# Patient Record
Sex: Female | Born: 1970 | Race: Black or African American | Hispanic: No | Marital: Married | Smoking: Former smoker
Health system: Southern US, Community
[De-identification: ages and names within clinical notes are randomized; demographics above are authoritative.]

## PROBLEM LIST (undated history)

## (undated) DIAGNOSIS — T7840XA Allergy, unspecified, initial encounter: Secondary | ICD-10-CM

## (undated) DIAGNOSIS — E079 Disorder of thyroid, unspecified: Secondary | ICD-10-CM

## (undated) HISTORY — DX: Allergy, unspecified, initial encounter: T78.40XA

## (undated) HISTORY — PX: DILATION AND CURETTAGE OF UTERUS: SHX78

---

## 1999-05-17 ENCOUNTER — Other Ambulatory Visit: Admission: RE | Admit: 1999-05-17 | Discharge: 1999-05-17 | Payer: Self-pay | Admitting: Obstetrics and Gynecology

## 1999-09-28 ENCOUNTER — Inpatient Hospital Stay (HOSPITAL_COMMUNITY): Admission: AD | Admit: 1999-09-28 | Discharge: 1999-10-01 | Payer: Self-pay | Admitting: *Deleted

## 1999-09-28 ENCOUNTER — Encounter (INDEPENDENT_AMBULATORY_CARE_PROVIDER_SITE_OTHER): Payer: Self-pay | Admitting: Specialist

## 2000-07-30 ENCOUNTER — Encounter: Payer: Self-pay | Admitting: Family Medicine

## 2000-07-30 ENCOUNTER — Ambulatory Visit (HOSPITAL_COMMUNITY): Admission: RE | Admit: 2000-07-30 | Discharge: 2000-07-30 | Payer: Self-pay | Admitting: Family Medicine

## 2001-04-22 ENCOUNTER — Encounter: Payer: Self-pay | Admitting: Internal Medicine

## 2001-04-22 ENCOUNTER — Ambulatory Visit (HOSPITAL_COMMUNITY): Admission: RE | Admit: 2001-04-22 | Discharge: 2001-04-22 | Payer: Self-pay | Admitting: Internal Medicine

## 2001-11-22 ENCOUNTER — Ambulatory Visit (HOSPITAL_COMMUNITY): Admission: RE | Admit: 2001-11-22 | Discharge: 2001-11-22 | Payer: Self-pay | Admitting: Internal Medicine

## 2001-11-22 ENCOUNTER — Encounter: Payer: Self-pay | Admitting: Internal Medicine

## 2002-07-31 ENCOUNTER — Emergency Department (HOSPITAL_COMMUNITY): Admission: EM | Admit: 2002-07-31 | Discharge: 2002-07-31 | Payer: Self-pay | Admitting: Emergency Medicine

## 2002-07-31 ENCOUNTER — Encounter: Payer: Self-pay | Admitting: Emergency Medicine

## 2002-10-09 ENCOUNTER — Ambulatory Visit (HOSPITAL_COMMUNITY): Admission: RE | Admit: 2002-10-09 | Discharge: 2002-10-09 | Payer: Self-pay | Admitting: *Deleted

## 2002-11-27 ENCOUNTER — Encounter: Admission: RE | Admit: 2002-11-27 | Discharge: 2002-11-27 | Payer: Self-pay | Admitting: *Deleted

## 2002-12-04 ENCOUNTER — Inpatient Hospital Stay (HOSPITAL_COMMUNITY): Admission: AD | Admit: 2002-12-04 | Discharge: 2002-12-04 | Payer: Self-pay | Admitting: *Deleted

## 2002-12-18 ENCOUNTER — Ambulatory Visit (HOSPITAL_COMMUNITY): Admission: RE | Admit: 2002-12-18 | Discharge: 2002-12-18 | Payer: Self-pay | Admitting: *Deleted

## 2003-02-06 ENCOUNTER — Ambulatory Visit (HOSPITAL_COMMUNITY): Admission: RE | Admit: 2003-02-06 | Discharge: 2003-02-06 | Payer: Self-pay | Admitting: Obstetrics and Gynecology

## 2003-02-15 ENCOUNTER — Inpatient Hospital Stay (HOSPITAL_COMMUNITY): Admission: AD | Admit: 2003-02-15 | Discharge: 2003-02-15 | Payer: Self-pay | Admitting: Obstetrics & Gynecology

## 2003-02-24 ENCOUNTER — Encounter (INDEPENDENT_AMBULATORY_CARE_PROVIDER_SITE_OTHER): Payer: Self-pay | Admitting: Specialist

## 2003-02-24 ENCOUNTER — Inpatient Hospital Stay (HOSPITAL_COMMUNITY): Admission: AD | Admit: 2003-02-24 | Discharge: 2003-02-26 | Payer: Self-pay | Admitting: Obstetrics & Gynecology

## 2003-03-31 ENCOUNTER — Inpatient Hospital Stay (HOSPITAL_COMMUNITY): Admission: AD | Admit: 2003-03-31 | Discharge: 2003-03-31 | Payer: Self-pay | Admitting: *Deleted

## 2003-08-15 ENCOUNTER — Encounter (INDEPENDENT_AMBULATORY_CARE_PROVIDER_SITE_OTHER): Payer: Self-pay | Admitting: Specialist

## 2003-08-15 ENCOUNTER — Inpatient Hospital Stay (HOSPITAL_COMMUNITY): Admission: AD | Admit: 2003-08-15 | Discharge: 2003-08-15 | Payer: Self-pay | Admitting: Obstetrics & Gynecology

## 2003-08-15 ENCOUNTER — Ambulatory Visit (HOSPITAL_COMMUNITY): Admission: AD | Admit: 2003-08-15 | Discharge: 2003-08-15 | Payer: Self-pay | Admitting: *Deleted

## 2003-08-25 ENCOUNTER — Encounter: Admission: RE | Admit: 2003-08-25 | Discharge: 2003-08-25 | Payer: Self-pay | Admitting: Obstetrics and Gynecology

## 2004-09-07 ENCOUNTER — Ambulatory Visit: Payer: Self-pay | Admitting: Nurse Practitioner

## 2004-09-08 ENCOUNTER — Ambulatory Visit (HOSPITAL_COMMUNITY): Admission: RE | Admit: 2004-09-08 | Discharge: 2004-09-08 | Payer: Self-pay | Admitting: Internal Medicine

## 2004-09-09 ENCOUNTER — Ambulatory Visit: Payer: Self-pay | Admitting: *Deleted

## 2005-06-19 ENCOUNTER — Emergency Department (HOSPITAL_COMMUNITY): Admission: EM | Admit: 2005-06-19 | Discharge: 2005-06-19 | Payer: Self-pay | Admitting: Emergency Medicine

## 2005-06-21 ENCOUNTER — Inpatient Hospital Stay (HOSPITAL_COMMUNITY): Admission: AD | Admit: 2005-06-21 | Discharge: 2005-06-21 | Payer: Self-pay | Admitting: Gynecology

## 2005-06-23 ENCOUNTER — Inpatient Hospital Stay (HOSPITAL_COMMUNITY): Admission: AD | Admit: 2005-06-23 | Discharge: 2005-06-23 | Payer: Self-pay | Admitting: Obstetrics & Gynecology

## 2005-06-30 ENCOUNTER — Inpatient Hospital Stay (HOSPITAL_COMMUNITY): Admission: RE | Admit: 2005-06-30 | Discharge: 2005-06-30 | Payer: Self-pay | Admitting: Obstetrics & Gynecology

## 2005-07-10 ENCOUNTER — Observation Stay (HOSPITAL_COMMUNITY): Admission: AD | Admit: 2005-07-10 | Discharge: 2005-07-11 | Payer: Self-pay | Admitting: Gynecology

## 2005-07-12 ENCOUNTER — Inpatient Hospital Stay (HOSPITAL_COMMUNITY): Admission: AD | Admit: 2005-07-12 | Discharge: 2005-07-12 | Payer: Self-pay | Admitting: Gynecology

## 2005-07-26 ENCOUNTER — Ambulatory Visit: Payer: Self-pay | Admitting: Gynecology

## 2005-09-06 ENCOUNTER — Ambulatory Visit: Payer: Self-pay | Admitting: Gynecology

## 2006-05-29 ENCOUNTER — Ambulatory Visit: Payer: Self-pay | Admitting: Nurse Practitioner

## 2006-06-01 ENCOUNTER — Ambulatory Visit (HOSPITAL_COMMUNITY): Admission: RE | Admit: 2006-06-01 | Discharge: 2006-06-01 | Payer: Self-pay | Admitting: Nurse Practitioner

## 2006-07-26 ENCOUNTER — Ambulatory Visit: Payer: Self-pay | Admitting: Internal Medicine

## 2006-08-01 ENCOUNTER — Ambulatory Visit: Payer: Self-pay | Admitting: Family Medicine

## 2006-08-01 ENCOUNTER — Encounter (INDEPENDENT_AMBULATORY_CARE_PROVIDER_SITE_OTHER): Payer: Self-pay | Admitting: Nurse Practitioner

## 2006-08-14 ENCOUNTER — Ambulatory Visit (HOSPITAL_COMMUNITY): Admission: RE | Admit: 2006-08-14 | Discharge: 2006-08-14 | Payer: Self-pay | Admitting: Nurse Practitioner

## 2006-10-17 ENCOUNTER — Encounter (INDEPENDENT_AMBULATORY_CARE_PROVIDER_SITE_OTHER): Payer: Self-pay | Admitting: *Deleted

## 2006-10-26 ENCOUNTER — Ambulatory Visit: Payer: Self-pay | Admitting: Gynecology

## 2006-10-26 ENCOUNTER — Encounter (INDEPENDENT_AMBULATORY_CARE_PROVIDER_SITE_OTHER): Payer: Self-pay | Admitting: Gynecology

## 2006-10-26 ENCOUNTER — Other Ambulatory Visit: Admission: RE | Admit: 2006-10-26 | Discharge: 2006-10-26 | Payer: Self-pay | Admitting: Gynecology

## 2006-11-07 ENCOUNTER — Ambulatory Visit: Payer: Self-pay | Admitting: *Deleted

## 2007-03-19 ENCOUNTER — Ambulatory Visit: Payer: Self-pay | Admitting: Internal Medicine

## 2007-03-21 ENCOUNTER — Ambulatory Visit (HOSPITAL_COMMUNITY): Admission: RE | Admit: 2007-03-21 | Discharge: 2007-03-21 | Payer: Self-pay | Admitting: Family Medicine

## 2007-09-30 ENCOUNTER — Encounter (INDEPENDENT_AMBULATORY_CARE_PROVIDER_SITE_OTHER): Payer: Self-pay | Admitting: Family Medicine

## 2007-09-30 ENCOUNTER — Ambulatory Visit: Payer: Self-pay | Admitting: Internal Medicine

## 2008-02-19 ENCOUNTER — Ambulatory Visit: Payer: Self-pay | Admitting: Internal Medicine

## 2008-02-19 ENCOUNTER — Encounter (INDEPENDENT_AMBULATORY_CARE_PROVIDER_SITE_OTHER): Payer: Self-pay | Admitting: Adult Health

## 2008-02-19 LAB — CONVERTED CEMR LAB
BUN: 10 mg/dL (ref 6–23)
Basophils Relative: 1 % (ref 0–1)
Calcium: 9.4 mg/dL (ref 8.4–10.5)
Chloride: 105 meq/L (ref 96–112)
Creatinine, Ser: 0.56 mg/dL (ref 0.40–1.20)
Eosinophils Absolute: 0.2 10*3/uL (ref 0.0–0.7)
Glucose, Bld: 81 mg/dL (ref 70–99)
HCT: 39.8 % (ref 36.0–46.0)
Hemoglobin: 12.9 g/dL (ref 12.0–15.0)
LH: 31.6 milliintl units/mL
Lymphocytes Relative: 45 % (ref 12–46)
MCHC: 32.4 g/dL (ref 30.0–36.0)
MCV: 80.1 fL (ref 78.0–100.0)
Monocytes Absolute: 0.4 10*3/uL (ref 0.1–1.0)
RDW: 13.5 % (ref 11.5–15.5)
Sodium: 141 meq/L (ref 135–145)
Total Bilirubin: 0.4 mg/dL (ref 0.3–1.2)
Total Protein: 7.3 g/dL (ref 6.0–8.3)

## 2008-02-20 ENCOUNTER — Encounter (INDEPENDENT_AMBULATORY_CARE_PROVIDER_SITE_OTHER): Payer: Self-pay | Admitting: Adult Health

## 2008-02-20 LAB — CONVERTED CEMR LAB: T4, Total: 8.3 ug/dL (ref 5.0–12.5)

## 2008-03-20 ENCOUNTER — Encounter (INDEPENDENT_AMBULATORY_CARE_PROVIDER_SITE_OTHER): Payer: Self-pay | Admitting: Adult Health

## 2008-03-20 ENCOUNTER — Ambulatory Visit: Payer: Self-pay | Admitting: Internal Medicine

## 2008-03-20 LAB — CONVERTED CEMR LAB
Cholesterol: 161 mg/dL (ref 0–200)
Triglycerides: 75 mg/dL (ref <150)

## 2008-03-24 ENCOUNTER — Ambulatory Visit: Payer: Self-pay | Admitting: Internal Medicine

## 2008-05-27 ENCOUNTER — Ambulatory Visit: Payer: Self-pay | Admitting: Family Medicine

## 2008-06-17 ENCOUNTER — Ambulatory Visit: Payer: Self-pay | Admitting: Obstetrics and Gynecology

## 2008-06-18 ENCOUNTER — Encounter: Payer: Self-pay | Admitting: Obstetrics & Gynecology

## 2008-06-22 ENCOUNTER — Ambulatory Visit (HOSPITAL_COMMUNITY): Admission: RE | Admit: 2008-06-22 | Discharge: 2008-06-22 | Payer: Self-pay | Admitting: Family Medicine

## 2008-07-01 ENCOUNTER — Ambulatory Visit: Payer: Self-pay | Admitting: Obstetrics & Gynecology

## 2008-07-24 ENCOUNTER — Ambulatory Visit: Payer: Self-pay | Admitting: Family Medicine

## 2008-11-12 ENCOUNTER — Ambulatory Visit: Payer: Self-pay | Admitting: Internal Medicine

## 2008-11-12 ENCOUNTER — Encounter (INDEPENDENT_AMBULATORY_CARE_PROVIDER_SITE_OTHER): Payer: Self-pay | Admitting: Adult Health

## 2008-11-12 LAB — CONVERTED CEMR LAB: TSH: 1.168 microintl units/mL (ref 0.350–4.500)

## 2009-01-11 ENCOUNTER — Ambulatory Visit: Payer: Self-pay | Admitting: Internal Medicine

## 2009-02-10 ENCOUNTER — Ambulatory Visit: Payer: Self-pay | Admitting: Obstetrics and Gynecology

## 2009-02-10 LAB — CONVERTED CEMR LAB: Estradiol: 17.3 pg/mL

## 2009-02-24 ENCOUNTER — Ambulatory Visit: Payer: Self-pay | Admitting: Obstetrics & Gynecology

## 2009-04-29 ENCOUNTER — Other Ambulatory Visit: Admission: RE | Admit: 2009-04-29 | Discharge: 2009-04-29 | Payer: Self-pay | Admitting: Gynecology

## 2009-04-29 ENCOUNTER — Ambulatory Visit: Payer: Self-pay | Admitting: Gynecology

## 2009-05-06 ENCOUNTER — Ambulatory Visit: Payer: Self-pay | Admitting: Gynecology

## 2010-02-19 ENCOUNTER — Encounter: Payer: Self-pay | Admitting: *Deleted

## 2010-06-14 NOTE — Group Therapy Note (Signed)
[name], KOHRS NO.:  [id number]   MEDICAL RECORD NO.:  [id number]          PATIENT TYPE:  WOC   LOCATION:  [facility]                   FACILITY:  [facility]   PHYSICIAN:  Johnella Moloney, MD        DATE OF BIRTH:  11-Jan-1971   DATE OF SERVICE:  06/17/2008                                  CLINIC NOTE   CHIEF COMPLAINT:  Secondary infertility.   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  The patient reports that she has had irregular menstrual  periods since menarche at age 21.  However, she has had 4 pregnancies.  She had 2 live births initially and then 2 miscarriages, the last 1 been  in 2007.  She said that since her last miscarriage, her periods have  been coming about every 2 months but is characterized with a small  amount of bleeding.  Over the last year, her periods came every 1-2  months.  However, since February 2010, she has been amenorrheic and has  not seen any bleeding.  She went to the [facility] for evaluation, and  their laboratory studies were remarkable for an [facility] of 39.3 which was  correspondent to a postmenopausal range and LH of 31.6.  She also had a  lipid profile that was normal and CBC and comprehensive metabolic panel  that was also normal at this visit.  The patient was referred to this  clinic for further evaluation of her secondary infertility and a  question of premature ovarian failure.   On interview of the patient who does not speak much English but was only  comfortable with a friend interpreting for her, the patient is primarily  [place] speaking.  The patient does report that she does not have any  family history of premature ovarian failure.  She has older sisters who  continue to have children.  Her mother died when she was very, very  young, so she does not remember if her mother went through menopause  early, and she  denies having any family history of autoimmune disorders  or other indications that could predispose her to premature ovarian  failure.   Of note, the diagnostic criteria for primary premature ovarian failure  in women younger than 40 years is the presence of 3 months of  amenorrhea, oligomenorrhea or dysfunctional uterine bleeding in  association with FSH levels in the menopausal range as defined by the  assay method employed, so she does not technically meet criteria for  this diagnosis.   PAST OBSTETRICAL/GYNECOLOGICAL HISTORY:  As above.  The patient had 2  vaginal deliveries and 2 missed ABs.  Her first missed AB was treated  with a dilatation and curettage.  The second one was treated with  Cytotec.  The patient had a Pap that showed atypical glandular cells in  2008 with normal follow-up.  Otherwise no cervical dysplasia.  The  patient denies any sexually transmitted infections.   PAST MEDICAL HISTORY:  None.   PAST SURGICAL  HISTORY:  Dilatation and curettage.   MEDICATIONS:  Prenatal vitamins daily.   ALLERGIES:  No known drug allergies.   SOCIAL HISTORY:  Patient is unemployed, and she smokes 4 cigarettes per  day and has been smoking for 2 years.  She does not drink any alcohol or  use any illicit drugs.  She denies any sexual or physical abuse.   FAMILY HISTORY:  Negative for any gynecologic cancers, breast cancer,  colon cancer or any other conditions.   PHYSICAL EXAMINATION:  Temperature 97.9, pulse 97, blood pressure  129/81, weight 186.8 pounds, height 65 inches.  In general, no apparent distress.  ABDOMEN:  Soft, nontender, nondistended.  PELVIC EXAM:  Is deferred at this visit.  EXTREMITIES:  No cyanosis, clubbing or edema; nontender.   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  Given that her FSH value was not significantly  elevated, it  was just noted to be 39, and normal range has a limit of  33, we will redo that [facility] analysis today.  We will also draw other labs  including HCG, TSH and prolactin levels as the diagnosis of premature  ovarian failure is possible.  Other evaluation would also include anti-  adrenal and anti-21 hydroxylase antibody screening and also antithyroid  peroxidase antibodies and TSH levels because of the association of  hypothyroidism with spontaneous premature ovarian failure.  If these  tests come back still concerned for premature ovarian failure, the  patient will need to be referred to reproductive endocrinology and  infertility specialist for further evaluation as she just might be a  candidate for gonadotropins or donor oocytes and in vitro fertilization.  Will also get a pelvic ultrasound to rule out any structural reasons for  her amenorrhea or infertility.  This plan was agreed upon by the  patient.  She will come back in 1 to 2 weeks to discuss the results of  her extensive laboratory evaluation and her pelvic ultrasound and for  possible referral to an REI specialist.           ______________________________  Johnella Moloney, MD     UD/MEDQ  D:  06/17/2008  T:  06/17/2008  Job:  [id number]

## 2010-06-17 NOTE — Op Note (Signed)
NAMEMARTITA, BRUMM NO.:  [id number]   MEDICAL RECORD NO.:  [id number]                   PATIENT TYPE:  AMB   LOCATION:  [facility]                                  [facility]   PHYSICIAN:  Hal Morales, M.D.             DATE OF BIRTH:  01/29/1971   DATE OF PROCEDURE:  08/15/2003  DATE OF DISCHARGE:                                 OPERATIVE REPORT   PREOPERATIVE DIAGNOSES:  Inevitable abortion, hemodynamically unstable with  hemorrhage.   POSTOPERATIVE DIAGNOSES:  Inevitable abortion, hemodynamically unstable with  hemorrhage.   OPERATION:  Suction dilatation and evacuation.   SURGEON:  Dr. Dierdre Forth   ANESTHESIA:  Monitored anesthesia care and local.   ESTIMATED BLOOD LOSS:  Less than 100 mL.   COMPLICATIONS:  None.   FINDINGS:  The uterus was enlarged to approximately 10 weeks size with a  large amount of products of conception obtained at D&E.  The blood type is A  positive.   DESCRIPTION OF PROCEDURE:  A discussion was held with the patient and her  husband via an interpreter concerning the diagnosis of inevitable abortion,  her hemodynamic instability, and the recommendation for dilation and  evacuation.  The risks of anesthesia, bleeding, infection, damage to  adjacent organs, and uterine perforation were explained via the interpreter  and after this discussion, the patient and her husband consented to proceed.  She was taken to the operating room after appropriate identification and  placed on the operating table.  After placement of equipment for monitored  anesthesia care, she was placed in the lithotomy position.  The perineum and  vagina were prepped with multiple layers of Betadine and a red Robinson  catheter used to empty the bladder.  The perineum was draped in a sterile  field.  A Graves speculum was placed in the vagina and a paracervical block  achieved with a total of 10 mL of 0.25% Marcaine in the 5 and 7  o'clock  positions.  A single-tooth tenaculum was placed on the anterior cervix.  The  cervix was already dilated enough to accommodate a #10 suction catheter, and  this was used to suction curette the entire endometrial cavity with a large  amount of products of conception obtained.  A sharp curette was then used to  ensure that all products of conception had been removed.  Hemostasis was  noted to be adequate.  All instruments were then removed from the vagina and  the patient taken from the operating room to the recovery room in  satisfactory condition having tolerated the procedure well with sponge and  instrument counts correct.  She received 1 g of Ancef intravenously while in  the operating room.   DISCHARGE INSTRUCTIONS:  Printed instructions for dilatation and evacuation  from [facility].   DISCHARGE MEDICATIONS:  1. Ibuprofen 600 mg p.o. q.6h. p.r.n. pain.  2. Methergine 0.2 mg p.o. q.6h. x 8 doses.  3. Doxycycline 100 mg p.o. b.i.d. x 5 days.   FOLLOW-UP:  The patient is to follow up in [facility]  on August 25, 2003, at 2:15 p.m.                                               Hal Morales, M.D.    VPH/MEDQ  D:  08/15/2003  T:  08/16/2003  Job:  [id number]

## 2010-06-17 NOTE — Group Therapy Note (Signed)
[name], SUPAK NO.:  [id number]   MEDICAL RECORD NO.:  [id number]                   PATIENT TYPE:  OUT   LOCATION:  [facility]                           [facility]  [facility]   PHYSICIAN:  Elsie Lincoln, MD                   DATE OF BIRTH:  1970-03-23   DATE OF SERVICE:  08/25/2003                                    CLINIC NOTE   REASON FOR VISIT:  The patient is a G3 para 2-0-1-2 status post a D&C on  August 15, 2003 by Dr. Pennie Rushing.  The patient had come diagnosed with a missed  AB earlier on July 16 and given Cytotec by Dr. Pennie Rushing.  The patient had  extreme bleeding and returned to the MAU.  She was taken to the operating  room where a D&C was done and there is positive POC dated on August 15, 2003.  Since then the patient has not had any vaginal bleeding.  The patient does  wish to become pregnant again immediately.  However, we told her to wait at  least for one normal menstrual cycle before becoming pregnant.  The patient  is not taking prenatal vitamins and she was reminded to do so and this was  important.  The patient also recommended to get early prenatal care.  She  had a baby here in January and she will follow up at [facility] for  those needs.   PHYSICAL EXAMINATION:  VITAL SIGNS:  Temperature 97.7, pulse 98, blood  pressure 109/65, weight 210.4, height 5 feet 7 inches.  ABDOMEN:  Soft, nontender, obese.  No rebound, no guarding.  PELVIC:  External genitalia with female circumcision.  Vagina pink, no  blood.  Cervix closed, nontender.  Adnexa and uterus are difficult to  palpate secondary to body habitus.   ASSESSMENT AND PLAN:  A 40 year old gravida 3 para 2-0-1-2 status post  Cytotec and D&C.  Positive products of conception.   1. Prenatal vitamins daily.  2. Able to get pregnant after one normal menstrual cycle.  3. Follow up at [facility].                                               Elsie Lincoln, MD    KL/MEDQ  D:   08/25/2003  T:  08/25/2003  Job:  [id number]

## 2010-06-17 NOTE — Op Note (Signed)
[facility]  PatientHORTENCIA, [name]                            MRN: [id number] Proc. Date: 09/29/99 Adm. Date:  [id number] Attending:  Cleatrice Burke                           Operative Report  PREOPERATIVE DIAGNOSES:       1. Intrauterine pregnancy at term.                               2. Prolonged rupture of membranes, exact                                  length uncertain.                               3. Nonreassuring fetal heart rate tracing.                               4. Status post clitorectomy.  POSTOPERATIVE DIAGNOSES:      1. Intrauterine pregnancy at term.                               2. Prolonged rupture of membranes, exact                                  length uncertain.                               3. Nonreassuring fetal heart rate tracing.                               4. Status post clitorectomy.                               5. Meconium stained amniotic fluid.                               6. Nuchal cord.  OPERATION/PROCEDURE:          Vacuum assisted vaginal delivery with midline episiotomy and repair of fourth degree extension of midline episiotomy.  ANESTHESIA:                   Local.  ESTIMATED BLOOD LOSS:         Estimated blood loss was 500 cc.  COMPLICATIONS:                None.  FINDINGS:                     The patient was delivered of a viable female infant, whose name is [name], with birth weight pending at the time of this dictation.  The infant was assigned Apgar scores of 9 at one minute and 9 at  five minutes.  Pediatrics was in attendance within one minute of the infants delivery.  DESCRIPTION OF PROCEDURE:     The patient had been pushing for approximately 90 minutes with some progress after complete dilatation.  The vertex was at the +2 station.  The patient had been having variable decelerations since the onset of pushing but over the last 15-20 minutes these variable decelerations had become  progressively deeper with nadir down to 80 beats per minute and with slower return to baseline.  The length of rupture of membranes was unknown and the status of the fluid was likewise unknown.  The patient was pushing moderately well but without evidence that delivery would ensue rapidly.  In light of some apparent deterioration in fetal heart rate and the expected slow progression toward delivery recommendation was made to the patient and her husband that vacuum assisted vaginal delivery be undertaken. The husband served as Engineer, technical sales and after explanation of the benefits of expeditious delivery and the risks of cephalohematoma and damage to maternal organs, the patient and her husband decided to proceed with vacuum assisted vaginal delivery.  The patient was in the lithotomy position and her perineum was prepped and draped.  The bladder was emptied by in-and-out catheterization.  Initially the Mityvac bell vacuum extractor was used to assist with descent of the fetal vertex to the perineum; however, that instrument popped off during the second application and the Kiwi was then applied for the third application, during which the infants head was delivered after cutting a midline episiotomy.  Upon recognition that meconium stained amniotic fluid was present the DeLee was used to suction the infants pharynx.  The loose nuchal cord was reduced over the head of the infant and the remainder of the infant delivered with maternal expulsive efforts.  The cord was clamped and cut.  The neonatal team had been paged and was in the delivery room within one minute of the infants delivery.  The appropriate cord blood was drawn and upon evidence of placental separation the placenta was removed with gentle traction and maternal expulsive efforts.  The cervix was inspected and found to be free of lacerations.  The perineum was inspected and a fourth degree extension of the midline episiotomy was noted.   This was then repaired in a layered fashion with sutures of 3-0 Vicryl.  Careful attention was paid to approximation of the muscle capsule.  Once the repair was complete ice packs were placed on the perineum and the mother was placed in the supine position for bonding with her infant, who remained in the LDR suite after the pediatricians had left.  The patient tolerated the procedure well.  It was anticipated that the infant would be admitted to the full-term nursery. DD:  09/29/99 TD:  09/30/99 Job: [id number]

## 2010-06-17 NOTE — Group Therapy Note (Signed)
NAMEZOIEY, CHRISTY NO.:  [id number]   MEDICAL RECORD NO.:  [id number]          PATIENT TYPE:  WOC   [facility]ATION[facility]  [facility]                   [facility]   PHYSICIAN:  Montey Hora, M.D.    DATE OF BIRTH:  [birth year]   DATE OF SERVICE:                                    CLINIC NOTE   HISTORY OF PRESENT ILLNESS:  This is a 40 year old G4, P2-0-2-2, who had a  missed AB at approximately two months treated with Cytotec on July 12, 2005,  at [facility].  Still having vaginal bleeding on June 27, seen at that time.  Had  normal HCG and told to follow up today.  The patient had discontinuation of  her bleeding with onset of period again on August 30, 2005.  That menstrual  cycle was mainly brown.  Discharge not a regular period.  Stopped after a  couple of days, and now she has had the reonset of some bleeding today.  She  does not use any contraception, and admits that she has been having regular  sexual relations having the desire to conceive a boy.  She is having cramps  like she is on her period now, and some slightly bleeding.  Is not taking  any medications.  Does not have any allergies.   SOCIAL HISTORY:  She is accompanied by an interpreter.   PHYSICAL EXAMINATION:  VITAL SIGNS:  Temperature 98.2, pulse 80, blood  pressure 124/85, weight 187.  GENERAL APPEARANCE:  WDWN, NAD.  HEART:  RRR without murmur.  LUNGS:  CTAP.  Normal respiratory effort.  ABDOMEN:  Nontender.  PELVIC:  A female circumcision.  Otherwise, normal left internal genitalia.  Moderate normal menstrual blood in the canal.  Uterus is 6-8 weeks size.  There is no adnexal masses or tenderness bilaterally.   ASSESSMENT/PLAN:  G4, P2-0-2-2, S/P, missed AB, responded with Cytotec.  Now  having apparent menstrual cycle.  Will check her urine pregnancy test.  If  positive, do a quantitative beta HCG, and at the same time check CBC to  assure that her anemia is resolving.     ______________________________  Montey Hora, M.D.     KR/MEDQ  D:  09/06/2005  T:  09/06/2005  Job:  [id number]

## 2010-06-17 NOTE — Discharge Summary (Signed)
[facility]  PatientSUGEY, [name]                            MRN: [id number] Adm. Date:  [id number] Disc. Date: [id number] Attending:  Cleatrice Burke Dictator:   Miguel Dibble, C.N.M.                           Discharge Summary  DATE OF BIRTH:                October 21, 1970.  ADMISSION DIAGNOSES:          1. Intrauterine pregnancy at 40 weeks, with                                  prolonged rupture of membranes x 44 hours                                  with minimal labor.                               2. Positive group beta streptococcus.                               3. Language barrier (speaks [place] only), with                                  limited interpretation available.                               4. 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  DISCHARGE DIAGNOSES:          1. Intrauterine pregnancy at 40 weeks, with                                  prolonged rupture of membranes x 44 hours                                  with minimal labor.                               2. Positive group beta streptococcus.                               3. Language barrier (speaks [place] only), with                                  limited interpretation available.  4. Delivered viable baby girl weighing 7 pounds                                  2 ounces, Apgars 9/9.                               5. Adequate repair of fourth degree laceration.                               6. Meconium-stained fluid.                               7. Non-reassuring fetal heart tones.                               8. Status post clitoridectomy for female                                  circumcision.  PROCEDURES:                   1. Local anesthesia.                               2. Repair of fourth degree extension and second                                  degree midline  episiotomy.                               3. Vacuum-assisted vaginal delivery.  COURSE OF HOSPITALIZATION:    At approximately 1545, [name] was admitted. She was a gravida 1, para 0 at 40 weeks with prolonged rupture of membranes for the previous three days.  Due to a language barrier, she was unable to successfully communicate her rupture of membranes until the 29th.  Her cervix was found to be 1 cm, 50% effaced, vertex at -2 by sterile speculum exam.  She was initiated on group beta strep protocol for a positive group beta strep history.  By 5 a.m. on the 30th, the next morning, she had progressed to 3 cm, 80% effaced, vertex at -2 to -1.  Pitocin was at 10 mU/min.  Contractions were every two to four minutes.  Her temperature was elevated to 100.9 and was controlled by a Tylenol suppository.  By 6:45, her temperature had decreased to 98.8.  By 7 a.m. on the 30th, her cervix had progressed to an anterior lip, then finally to completely effaced and 0 station with vertex.  There were small variables with her pushing.  Contractions were every two to three minutes.  Fetal heart rate was reassuring.  By 10:40, vertex was at +2 station but she was having slow progress with descent and progressively deepening variable deceleration over the 90 minutes of pushing.  Risks and benefits were discussed with patient and her husband as interpreter of a vacuum-assisted delivery and they agreed.  She proceeded to have a vacuum-assisted  vaginal delivery over a second degree midline episiotomy with local anesthetic.  She delivered a viable baby girl, [name], weight 7 pounds 2 ounces, Apgars 9/9. Estimated blood loss:  Approximately 500 cc.  Her fourth degree extension was repaired without difficulty.  She was transferred to recovery in stable condition.  On postpartum day #1, August 31st, she was recovering well.  Her hemoglobin dropped from 10.5 to 8.4.  Fundus was firm.  Lochia small. Perineum was  healing satisfactorily.  She was passing flatus.  With assistance with interpretation from her husband, she appeared to be breast and bottle feeding satisfactorily.  She was visited by [facility], social security, pediatrics and birth Engineer, structural.  On postpartum day #2, September 1st, she was breast feeding well.  She had not yet had a bowel movement but was passing flatus satisfactorily.  She was discharged home in stable condition after having a visit from the pediatrician, Dr. Estell Harpin, and verbalized understanding with her husband that she would follow up in the next day for bilirubin testing of their baby and would follow up within a week at [facility].  DISCHARGE INSTRUCTIONS:       Per [facility] booklet, interpreted by husband.  Patient would decide upon contraception at her six-week visit.  DISCHARGE MEDICATIONS:        Motrin, Tylox, Hemocyte twice daily for iron supplementation, prenatal vitamins and Colace twice daily.  FOLLOWUP:                     Discharge followup in six weeks at [facility]. DD:  10/01/99 TD:  10/04/99 Job: [id number]

## 2010-06-17 NOTE — H&P (Signed)
[facility]  PatientROSANGELICA, Howell                            MRN: [id number] Adm. Date:  09/28/99 Attending:  Cecilio Asper, M.D. Dictator:   Wynelle Bourgeois, P.A.                         History and Physical  HISTORY OF PRESENT ILLNESS:   The patient is a 40 year old gravida 1, para 0, at 40-0/7 weeks who presented to the office today with complaints of cough. During the visit, she mentioned that she had been leaking fluid since September 26, 1999, at 8:30 p.m.  She reports few uterine contractions and positive fetal movement.  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  PRENATAL LABORATORY DATA:     Hemoglobin 11.2, hematocrit 33.4, platelets 223. Blood type O-positive.  Antibody screen negative.  Sickle cell trait negative. RPR reactive with VTPPA nonreactive.  Rubella immune.  HBsAg negative.  HIV nonreactive.  Pap test normal.  Gonorrhea negative.  Chlamydia negative. Glucose challenge within normal limits.  AFP declined.  GBS positive.  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  FAMILY HISTORY:               Negative per patient report.  GENETIC HISTORY:              Remarkable for the patient and the father of the baby being first cousins.  SOCIAL HISTORY:               The patient is married to [place] who is involved and supportive.  She has a female friend here that is interpreting for her today.  She is of the Muslim faith.  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  PHYSICAL EXAMINATION:  VITAL SIGNS:                  Stable and the patient is afebrile.  White count is within normal limits.  HEENT:                        Within normal limits.  NECK:                         Thyroid normal and not  enlarged.  CHEST:                        Clear to auscultation bilaterally.  HEART:                        Regular rate and rhythm with no murmur.  BREASTS:                      Soft and nontender, no masses.  ABDOMEN:                      Gravid at 40 cm, vertex Shannon.  EFM shows a reactive fetal heart rate tracing with positive accelerations, average variability, and no decelerations.  She has occasional uterine contractions with  are mild.  Speculum exam in the office was positive for rupture of membranes of clear fluid.  CERVICAL EXAMINATION:         Cervical exam in the office was 1 cm, 50% effaced, minus 2 station with a vertex presentation.  Female genitalia remarkable for clitorectomy and partial ________ with expected scar tissue present.  EXTREMITIES:                  Within normal limits with trace edema.  ASSESSMENT:                   1. Intrauterine pregnancy at 40-0/7 weeks.                               2. Premature rupture of membranes at term times                                  44 hours.                               3. Minimal labor.                               4. Group B Streptococcus positive.  PLAN:                         1. Admit to birthing suite, Dr. Kathryne Sharper                                  aware.                               2. Routine C&M orders.                               3. Penicillin per protocol for GBS prophylaxis.                               4. Limited vaginal exams.                               5. Low dose Pitocin augmentation was discussed                                  with the patient and her husband in the                                  office, as ________ with expected management                                  and the patient and her husband elects to  proceed with low dose Pitocin augmentation                                  after the risks and benefits were reviewed.                                6. The patient desires suturing of her female                                  circumcision should it lacerate during                                  delivery.                               7. Further orders as the day permits. DD:  09/28/99 TD:  09/28/99 Job: [id number]

## 2010-11-10 LAB — POCT PREGNANCY, URINE
Operator id: 297281
Preg Test, Ur: NEGATIVE

## 2011-04-17 ENCOUNTER — Emergency Department (HOSPITAL_BASED_OUTPATIENT_CLINIC_OR_DEPARTMENT_OTHER)
Admission: EM | Admit: 2011-04-17 | Discharge: 2011-04-17 | Disposition: A | Discharge status: Home / Self Care | Payer: Worker's Compensation | Attending: Emergency Medicine | Admitting: Emergency Medicine

## 2011-04-17 ENCOUNTER — Emergency Department (INDEPENDENT_AMBULATORY_CARE_PROVIDER_SITE_OTHER): Payer: Worker's Compensation

## 2011-04-17 ENCOUNTER — Emergency Department (HOSPITAL_BASED_OUTPATIENT_CLINIC_OR_DEPARTMENT_OTHER): Payer: Self-pay | Attending: Emergency Medicine

## 2011-04-17 ENCOUNTER — Encounter (HOSPITAL_BASED_OUTPATIENT_CLINIC_OR_DEPARTMENT_OTHER): Payer: Self-pay | Admitting: Family Medicine

## 2011-04-17 DIAGNOSIS — M545 Low back pain, unspecified: Secondary | ICD-10-CM | POA: Insufficient documentation

## 2011-04-17 DIAGNOSIS — R51 Headache: Secondary | ICD-10-CM

## 2011-04-17 DIAGNOSIS — S139XXA Sprain of joints and ligaments of unspecified parts of neck, initial encounter: Secondary | ICD-10-CM | POA: Insufficient documentation

## 2011-04-17 DIAGNOSIS — S0003XA Contusion of scalp, initial encounter: Secondary | ICD-10-CM | POA: Insufficient documentation

## 2011-04-17 DIAGNOSIS — W19XXXA Unspecified fall, initial encounter: Secondary | ICD-10-CM

## 2011-04-17 DIAGNOSIS — IMO0002 Reserved for concepts with insufficient information to code with codable children: Secondary | ICD-10-CM | POA: Insufficient documentation

## 2011-04-17 DIAGNOSIS — S161XXA Strain of muscle, fascia and tendon at neck level, initial encounter: Secondary | ICD-10-CM

## 2011-04-17 DIAGNOSIS — M542 Cervicalgia: Secondary | ICD-10-CM | POA: Insufficient documentation

## 2011-04-17 DIAGNOSIS — Z0389 Encounter for observation for other suspected diseases and conditions ruled out: Secondary | ICD-10-CM | POA: Insufficient documentation

## 2011-04-17 DIAGNOSIS — W010XXA Fall on same level from slipping, tripping and stumbling without subsequent striking against object, initial encounter: Secondary | ICD-10-CM | POA: Insufficient documentation

## 2011-04-17 DIAGNOSIS — R55 Syncope and collapse: Secondary | ICD-10-CM | POA: Insufficient documentation

## 2011-04-17 DIAGNOSIS — R52 Pain, unspecified: Secondary | ICD-10-CM

## 2011-04-17 DIAGNOSIS — S39012A Strain of muscle, fascia and tendon of lower back, initial encounter: Secondary | ICD-10-CM

## 2011-04-17 DIAGNOSIS — S0083XA Contusion of other part of head, initial encounter: Secondary | ICD-10-CM

## 2011-04-17 DIAGNOSIS — R220 Localized swelling, mass and lump, head: Secondary | ICD-10-CM | POA: Insufficient documentation

## 2011-04-17 HISTORY — DX: Disorder of thyroid, unspecified: E07.9

## 2011-04-17 MED ORDER — OXYCODONE-ACETAMINOPHEN 5-325 MG PO TABS: 2.0000 | ORAL_TABLET | ORAL | Status: AC | PRN

## 2011-04-17 MED ORDER — OXYCODONE-ACETAMINOPHEN 5-325 MG PO TABS
2.0000 | ORAL_TABLET | Freq: Once | ORAL | Status: AC
  Administered 2011-04-17: 2 via ORAL
  Filled 2011-04-17: qty 2

## 2011-04-17 NOTE — ED Notes (Signed)
Pt sts she tripped over cord at work yesterday morning and fell injuring left side of face, low back and right knee. Pt sts everything is "sore". Pt denies loc.

## 2011-04-17 NOTE — Discharge Instructions (Signed)
Head Injury, Adult You have had a head injury that does not appear serious at this time. A concussion is a state of changed mental ability, usually from a blow to the head. You should take clear liquids for the rest of the day and then resume your regular diet. You should not take sedatives or alcoholic beverages for as long as directed by your caregiver after discharge. After injuries such as yours, most problems occur within the first 24 hours. SYMPTOMS These minor symptoms may be experienced after discharge:  Memory difficulties.   Dizziness.   Headaches.   Double vision.   Hearing difficulties.   Depression.   Tiredness.   Weakness.   Difficulty with concentration.  If you experience any of these problems, you should not be alarmed. A concussion requires a few days for recovery. Many patients with head injuries frequently experience such symptoms. Usually, these problems disappear without medical care. If symptoms last for more than one day, notify your caregiver. See your caregiver sooner if symptoms are becoming worse rather than better. HOME CARE INSTRUCTIONS   During the next 24 hours you must stay with someone who can watch you for the warning signs listed below.  Although it is unlikely that serious side effects will occur, you should be aware of signs and symptoms which may necessitate your return to this location. Side effects may occur up to 7 - 10 days following the injury. It is important for you to carefully monitor your condition and contact your caregiver or seek immediate medical attention if there is a change in your condition. SEEK IMMEDIATE MEDICAL CARE IF:   There is confusion or drowsiness.   You can not awaken the injured person.   There is nausea (feeling sick to your stomach) or continued, forceful vomiting.   You notice dizziness or unsteadiness which is getting worse, or inability to walk.   You have convulsions or unconsciousness.   You experience  severe, persistent headaches not relieved by over-the-counter or prescription medicines for pain. (Do not take aspirin as this impairs clotting abilities). Take other pain medications only as directed.   You can not use arms or legs normally.   There is clear or bloody discharge from the nose or ears.  MAKE SURE YOU:   Understand these instructions.   Will watch your condition.   Will get help right away if you are not doing well or get worse.  Document Released: 01/16/2005 Document Revised: 01/05/2011 Document Reviewed: 12/04/2008 [facility] Patient Information 2012 [place], [place].Facial and Scalp Contusions You have a contusion (bruise) on your face or scalp. Injuries around the face and head generally cause a lot of swelling, especially around the eyes. This is because the blood supply to this area is good and tissues are loose. Swelling from a contusion is usually better in 2-3 days. It may take a week or longer for a "black eye" to clear up completely. HOME CARE INSTRUCTIONS   Apply ice packs to the injured area for about 15 to 20 minutes, 3 to 4 times a day, for the first couple days. This helps keep swelling down.   Use mild pain medicine as needed or instructed by your caregiver.   You may have a mild headache, slight dizziness, nausea, and weakness for a few days. This usually clears up with bed rest and mild pain medications.   Contact your caregiver if you are concerned about facial defects or have any difficulty with your bite or develop pain with  chewing.  SEEK IMMEDIATE MEDICAL CARE IF:  You develop severe pain or a headache, unrelieved by medication.   You develop unusual sleepiness, confusion, personality changes, or vomiting.   You have a persistent nosebleed, double or blurred vision, or drainage from the nose or ear.   You have difficulty walking or using your arms or legs.  MAKE SURE YOU:   Understand these instructions.   Will watch your condition.   Will  get help right away if you are not doing well or get worse.  Document Released: 02/24/2004 Document Revised: 01/05/2011 Document Reviewed: 12/02/2010 [facility] Patient Information 2012 [place], [place].Lumbosacral Strain Lumbosacral strain is one of the most common causes of back pain. There are many causes of back pain. Most are not serious conditions. CAUSES  Your backbone (spinal column) is made up of 24 main vertebral bodies, the sacrum, and the coccyx. These are held together by muscles and tough, fibrous tissue (ligaments). Nerve roots pass through the openings between the vertebrae. A sudden move or injury to the back may cause injury to, or pressure on, these nerves. This may result in localized back pain or pain movement (radiation) into the buttocks, down the leg, and into the foot. Sharp, shooting pain from the buttock down the back of the leg (sciatica) is frequently associated with a ruptured (herniated) disk. Pain may be caused by muscle spasm alone. Your caregiver can often find the cause of your pain by the details of your symptoms and an exam. In some cases, you may need tests (such as X-rays). Your caregiver will work with you to decide if any tests are needed based on your specific exam. HOME CARE INSTRUCTIONS   Avoid an underactive lifestyle. Active exercise, as directed by your caregiver, is your greatest weapon against back pain.   Avoid hard physical activities (tennis, racquetball, waterskiing) if you are not in proper physical condition for it. This may aggravate or create problems.   If you have a back problem, avoid sports requiring sudden body movements. Swimming and walking are generally safer activities.   Maintain good posture.   Avoid becoming overweight (obese).   Use bed rest for only the most extreme, sudden (acute) episode. Your caregiver will help you determine how much bed rest is necessary.   For acute conditions, you may put ice on the injured area.   Put  ice in a plastic bag.   Place a towel between your skin and the bag.   Leave the ice on for 15 to 20 minutes at a time, every 2 hours, or as needed.   After you are improved and more active, it may help to apply heat for 30 minutes before activities.  See your caregiver if you are having pain that lasts longer than expected. Your caregiver can advise appropriate exercises or therapy if needed. With conditioning, most back problems can be avoided. SEEK IMMEDIATE MEDICAL CARE IF:   You have numbness, tingling, weakness, or problems with the use of your arms or legs.   You experience severe back pain not relieved with medicines.   There is a change in bowel or bladder control.   You have increasing pain in any area of the body, including your belly (abdomen).   You notice shortness of breath, dizziness, or feel faint.   You feel sick to your stomach (nauseous), are throwing up (vomiting), or become sweaty.   You notice discoloration of your toes or legs, or your feet get very cold.  Your back pain is getting worse.   You have a fever.  MAKE SURE YOU:   Understand these instructions.   Will watch your condition.   Will get help right away if you are not doing well or get worse.  Document Released: 10/26/2004 Document Revised: 01/05/2011 Document Reviewed: 04/17/2008 [facility] Patient Information 2012 [place], [place].

## 2011-04-17 NOTE — ED Notes (Signed)
Patient transported to CT and radiology 

## 2011-04-17 NOTE — ED Provider Notes (Signed)
History     CSN: [id number]  Arrival date & time 04/17/11  7829   First MD Initiated Contact with Patient 04/17/11 1030      Chief Complaint  Patient presents with  . Fall    (Consider location/radiation/quality/duration/timing/severity/associated sxs/prior treatment) HPI Comments: Patient states that she was walking down the hallway and tripped and fell. She hit her head and did have a loss of consciousness. She said that she had some bleeding from her lower lip. She woke up this morning feeling sore all over. She has some pain to the left side of her face and all over her head. She also has some pain in her neck and her lower back and feels sore all over. Denies a numbness or weakness in her extremities denies any events preceding the fall, she states that she just tripped and fell. She states, that she's been getting more sore since yesterday  Patient is a 41 y.o. female presenting with fall. The history is provided by the patient.  Fall The accident occurred yesterday. The fall occurred while walking. Associated symptoms include headaches. Pertinent negatives include no fever, no numbness, no abdominal pain, no nausea, no vomiting and no hematuria.    Past Medical History  Diagnosis Date  . Thyroid disease     History reviewed. No pertinent past surgical history.  No family history on file.  History  Substance Use Topics  . Smoking status: Current Some Day Smoker  . Smokeless tobacco: Not on file  . Alcohol Use: No    OB History    Grav Para Term Preterm Abortions TAB SAB Ect Mult Living                  Review of Systems  Constitutional: Negative for fever, chills, diaphoresis and fatigue.  HENT: Positive for neck pain. Negative for congestion, rhinorrhea and sneezing.   Eyes: Negative.   Respiratory: Negative for cough, chest tightness and shortness of breath.   Cardiovascular: Negative for chest pain and leg swelling.  Gastrointestinal: Negative for nausea,  vomiting, abdominal pain, diarrhea and blood in stool.  Genitourinary: Negative for frequency, hematuria, flank pain and difficulty urinating.  Musculoskeletal: Positive for back pain. Negative for arthralgias.  Skin: Negative for rash.  Neurological: Positive for syncope and headaches. Negative for dizziness, speech difficulty, weakness and numbness.    Allergies  Review of patient's allergies indicates no known allergies.  Home Medications   Current Outpatient Rx  Name Route Sig Dispense Refill  . UNKNOWN TO PATIENT      . OXYCODONE-ACETAMINOPHEN 5-325 MG PO TABS Oral Take 2 tablets by mouth every 4 (four) hours as needed for pain. 15 tablet 0    BP 119/79  Pulse 68  Temp(Src) 97.7 F (36.5 C) (Oral)  Resp 16  Ht 5' (1.524 m)  Wt 192 lb (87.091 kg)  BMI 37.50 kg/m2  SpO2 99%  Physical Exam  Constitutional: She is oriented to person, place, and time. She appears well-developed and well-nourished.  HENT:  Head: Normocephalic.       Patient has swelling and pain along the left maxilla in the left side of the chin. Teeth appear intact, but there is a small healing laceration in the inner aspect of the lower lip. Tongue appears intact  Eyes: Conjunctivae and EOM are normal. Pupils are equal, round, and reactive to light.  Neck:       Moderate tenderness in the mid and lower cervical spine. No pain in the thoracic  spine. There some mild tenderness in the mid lumbar sacral spine. No step-offs or deformities are noted  Cardiovascular: Normal rate, regular rhythm and normal heart sounds.   Pulmonary/Chest: Effort normal and breath sounds normal. No respiratory distress. She has no wheezes. She has no rales. She exhibits no tenderness.  Abdominal: Soft. Bowel sounds are normal. There is no tenderness. There is no rebound and no guarding.  Musculoskeletal: Normal range of motion. She exhibits no edema.       She has no specific area of pain on palpation or range of motion of the  extremities  Lymphadenopathy:    She has no cervical adenopathy.  Neurological: She is alert and oriented to person, place, and time. She has normal strength. No cranial nerve deficit or sensory deficit. Coordination normal.  Skin: Skin is warm and dry. No rash noted.  Psychiatric: She has a normal mood and affect.    ED Course  Procedures (including critical care time) Results for orders placed in visit on 02/10/09  CONVERTED [facility] LAB      Component Value Range   Estradiol 17.3     FSH 43.6     LH 32.4     hCG, Beta Chain, Quant, S <2.0 mIU/mL     Dg Chest 2 View  04/17/2011  *RADIOLOGY REPORT*  Clinical Data: Fall, body aches.  CHEST - 2 VIEW  Comparison: None.  Findings: Normal heart size with clear lung fields.  No bony abnormality.  No pneumothorax.  No visible effusion.  IMPRESSION: Negative exam.  Original Report Authenticated By: Elsie Stain, M.D.   Dg Lumbar Spine Complete  04/17/2011  *RADIOLOGY REPORT*  Clinical Data: Fall yesterday with low back pain.  LUMBAR SPINE - COMPLETE 4+ VIEW  Comparison: None.  Findings: Transitional lumbosacral vertebral body.  This will be labeled S1 for the purposes of this study (with the last open disc at S1-S2).  Minimal convex left lumbar spine curvature. Sacroiliac joints are symmetric.  Mild lower lumbar spondylosis. Maintenance of vertebral body height and alignment.  IMPRESSION: No acute osseous abnormality.  Original Report Authenticated By: Consuello Bossier, M.D.   Ct Head Wo Contrast  04/17/2011  No skull fracture or intracranial hemorrhage.  Mild nonspecific left frontal subcortical white matter type changes.*RADIOLOGY REPORT*  Clinical Data:  Fall yesterday.  Left neck pain and left cheek pain.  CT HEAD WITHOUT CONTRAST CT MAXILLOFACIAL WITHOUT CONTRAST CT CERVICAL SPINE WITHOUT CONTRAST  Technique:  Multidetector CT imaging of the head, cervical spine, and maxillofacial structures were performed using the standard protocol without  intravenous contrast. Multiplanar CT image reconstructions of the cervical spine and maxillofacial structures were also generated.  Comparison:   None  CT HEAD  Findings: No skull fracture or intracranial hemorrhage.  Mild nonspecific left frontal subcortical white matter type changes.  IMPRESSION: No skull fracture or intracranial hemorrhage.  Mild nonspecific left frontal subcortical white matter type changes.  CT MAXILLOFACIAL  Findings:  No facial fracture noted.  Mild mucosal thickening inferior aspect of maxillary sinuses greater on the left.  IMPRESSION: No facial fracture.  CT CERVICAL SPINE  Findings:   Exam limited by shoulder artifact.  No cervical spine fracture or malalignment noted.  Cervical spondylotic changes.  Scattered normal to slightly prominent sized lymph nodes throughout the neck bilaterally.  Etiology/significance indeterminate.  Biapical parenchymal changes with adjacent pleural thickening incompletely assessed on present exam.  Baseline chest x-ray may be considered for further delineation.  IMPRESSION:  No cervical  spine fracture.  Please see above discussion.  Original Report Authenticated By: Fuller Canada, M.D.   Ct Cervical Spine Wo Contrast  04/17/2011  No skull fracture or intracranial hemorrhage.  Mild nonspecific left frontal subcortical white matter type changes.*RADIOLOGY REPORT*  Clinical Data:  Fall yesterday.  Left neck pain and left cheek pain.  CT HEAD WITHOUT CONTRAST CT MAXILLOFACIAL WITHOUT CONTRAST CT CERVICAL SPINE WITHOUT CONTRAST  Technique:  Multidetector CT imaging of the head, cervical spine, and maxillofacial structures were performed using the standard protocol without intravenous contrast. Multiplanar CT image reconstructions of the cervical spine and maxillofacial structures were also generated.  Comparison:   None  CT HEAD  Findings: No skull fracture or intracranial hemorrhage.  Mild nonspecific left frontal subcortical white matter type changes.   IMPRESSION: No skull fracture or intracranial hemorrhage.  Mild nonspecific left frontal subcortical white matter type changes.  CT MAXILLOFACIAL  Findings:  No facial fracture noted.  Mild mucosal thickening inferior aspect of maxillary sinuses greater on the left.  IMPRESSION: No facial fracture.  CT CERVICAL SPINE  Findings:   Exam limited by shoulder artifact.  No cervical spine fracture or malalignment noted.  Cervical spondylotic changes.  Scattered normal to slightly prominent sized lymph nodes throughout the neck bilaterally.  Etiology/significance indeterminate.  Biapical parenchymal changes with adjacent pleural thickening incompletely assessed on present exam.  Baseline chest x-ray may be considered for further delineation.  IMPRESSION:  No cervical spine fracture.  Please see above discussion.  Original Report Authenticated By: Fuller Canada, M.D.   Ct Maxillofacial Wo Cm  04/17/2011  No skull fracture or intracranial hemorrhage.  Mild nonspecific left frontal subcortical white matter type changes.*RADIOLOGY REPORT*  Clinical Data:  Fall yesterday.  Left neck pain and left cheek pain.  CT HEAD WITHOUT CONTRAST CT MAXILLOFACIAL WITHOUT CONTRAST CT CERVICAL SPINE WITHOUT CONTRAST  Technique:  Multidetector CT imaging of the head, cervical spine, and maxillofacial structures were performed using the standard protocol without intravenous contrast. Multiplanar CT image reconstructions of the cervical spine and maxillofacial structures were also generated.  Comparison:   None  CT HEAD  Findings: No skull fracture or intracranial hemorrhage.  Mild nonspecific left frontal subcortical white matter type changes.  IMPRESSION: No skull fracture or intracranial hemorrhage.  Mild nonspecific left frontal subcortical white matter type changes.  CT MAXILLOFACIAL  Findings:  No facial fracture noted.  Mild mucosal thickening inferior aspect of maxillary sinuses greater on the left.  IMPRESSION: No facial fracture.   CT CERVICAL SPINE  Findings:   Exam limited by shoulder artifact.  No cervical spine fracture or malalignment noted.  Cervical spondylotic changes.  Scattered normal to slightly prominent sized lymph nodes throughout the neck bilaterally.  Etiology/significance indeterminate.  Biapical parenchymal changes with adjacent pleural thickening incompletely assessed on present exam.  Baseline chest x-ray may be considered for further delineation.  IMPRESSION:  No cervical spine fracture.  Please see above discussion.  Original Report Authenticated By: Fuller Canada, M.D.        1. Facial contusion   2. Neck strain   3. Back strain       MDM  No evidence of facial fx, ICH, spinal fx.  Will tx with pain meds.  F/u with PMD.  Advised pt about enlarged lymph nodes that may need f/u        Rolan Bucco, MD 04/17/11 1340

## 2011-07-13 ENCOUNTER — Encounter (HOSPITAL_COMMUNITY): Payer: Self-pay | Admitting: *Deleted

## 2011-07-13 ENCOUNTER — Emergency Department (HOSPITAL_COMMUNITY)
Admission: EM | Admit: 2011-07-13 | Discharge: 2011-07-13 | Disposition: A | Discharge status: Home / Self Care | Payer: Self-pay | Attending: Emergency Medicine | Admitting: Emergency Medicine

## 2011-07-13 DIAGNOSIS — M545 Low back pain, unspecified: Secondary | ICD-10-CM | POA: Insufficient documentation

## 2011-07-13 LAB — URINALYSIS, ROUTINE W REFLEX MICROSCOPIC
Bilirubin Urine: NEGATIVE
Glucose, UA: NEGATIVE mg/dL
Ketones, ur: NEGATIVE mg/dL
Leukocytes, UA: NEGATIVE
Specific Gravity, Urine: 1.019 (ref 1.005–1.030)
pH: 5.5 (ref 5.0–8.0)

## 2011-07-13 NOTE — ED Notes (Signed)
To ED for eval of lower back pain with radiation to lower abd since yesterday. Denies pain with urination. Denies vomiting or diarrhea. Seen at [facility] 2 months ago after falling while at work

## 2013-04-09 ENCOUNTER — Ambulatory Visit: Payer: Self-pay | Admitting: Physician Assistant

## 2013-04-09 ENCOUNTER — Ambulatory Visit: Payer: Self-pay

## 2013-04-09 VITALS — BP 122/70 | HR 73 | Temp 98.5°F | Resp 16 | Ht 66.0 in | Wt 213.0 lb

## 2013-04-09 DIAGNOSIS — R109 Unspecified abdominal pain: Secondary | ICD-10-CM

## 2013-04-09 DIAGNOSIS — M549 Dorsalgia, unspecified: Secondary | ICD-10-CM

## 2013-04-09 DIAGNOSIS — M62838 Other muscle spasm: Secondary | ICD-10-CM

## 2013-04-09 LAB — POCT CBC
Granulocyte percent: 44.4 %G (ref 37–80)
HCT, POC: 39.2 % (ref 37.7–47.9)
Hemoglobin: 12.1 g/dL — AB (ref 12.2–16.2)
Lymph, poc: 2.2 (ref 0.6–3.4)
MCH, POC: 25.3 pg — AB (ref 27–31.2)
MCHC: 30.9 g/dL — AB (ref 31.8–35.4)
MCV: 81.9 fL (ref 80–97)
MID (cbc): 0.4 (ref 0–0.9)
MPV: 10.4 fL (ref 0–99.8)
POC Granulocyte: 2 (ref 2–6.9)
POC LYMPH PERCENT: 47.9 %L (ref 10–50)
POC MID %: 7.7 %M (ref 0–12)
Platelet Count, POC: 219 10*3/uL (ref 142–424)
RBC: 4.79 M/uL (ref 4.04–5.48)
RDW, POC: 13.9 %
WBC: 4.6 10*3/uL (ref 4.6–10.2)

## 2013-04-09 LAB — POCT UA - MICROSCOPIC ONLY
Bacteria, U Microscopic: NEGATIVE
Casts, Ur, LPF, POC: NEGATIVE
Crystals, Ur, HPF, POC: NEGATIVE
Mucus, UA: NEGATIVE
RBC, urine, microscopic: NEGATIVE
Yeast, UA: NEGATIVE

## 2013-04-09 LAB — POCT URINALYSIS DIPSTICK
Bilirubin, UA: NEGATIVE
Blood, UA: NEGATIVE
Glucose, UA: NEGATIVE
Ketones, UA: NEGATIVE
Leukocytes, UA: NEGATIVE
Nitrite, UA: NEGATIVE
Protein, UA: NEGATIVE
Spec Grav, UA: 1.025
Urobilinogen, UA: 1
pH, UA: 6

## 2013-04-09 MED ORDER — MELOXICAM 7.5 MG PO TABS: 7.5000 mg | ORAL_TABLET | Freq: Two times a day (BID) | ORAL | Status: DC

## 2013-04-09 MED ORDER — CYCLOBENZAPRINE HCL 5 MG PO TABS: 5.0000 mg | ORAL_TABLET | Freq: Every evening | ORAL | Status: DC | PRN

## 2013-04-09 NOTE — Progress Notes (Signed)
Subjective:    Patient ID: [name], female    DOB: Nov 23, 1970, 43 y.o.   MRN: [id number]  HPI 43 year old female presents for evaluation of acute onset of back pain. She is here today with her 2 daughters who are translating for her.  Symptoms have been progressively worsening over the last week. She has pain with ROM, bending, lifting, and walking. States pain radiates down both legs.  No acute injury but she does work as a Advertising copywriter although she denies any heavy lifting, bending or stooping.  She has taken 1 Aleve daily which helps minimally.    Denies paresthesias, weakness, saddle anesthesia, bowel/bladder incontinence, abdominal pain, vaginal discharge, bleeding, hematuria, dysuria, nausea, or vomiting.    No hx of previous back injuries or surgeries.   LNMP 2 years ago.    Review of Systems  Constitutional: Positive for fever (subjective) and chills.  Gastrointestinal: Negative for nausea, vomiting and abdominal pain.  Genitourinary: Positive for frequency and flank pain. Negative for dysuria, vaginal bleeding, vaginal discharge, difficulty urinating and vaginal pain.  Musculoskeletal: Positive for back pain.  Neurological: Negative for weakness and numbness.       Objective:   Physical Exam  Constitutional: She is oriented to person, place, and time. She appears well-developed and well-nourished.  HENT:  Head: Normocephalic and atraumatic.  Right Ear: External ear normal.  Left Ear: External ear normal.  Eyes: Conjunctivae are normal.  Neck: Normal range of motion.  Cardiovascular: Normal rate.   Pulmonary/Chest: Effort normal.  Musculoskeletal:       Lumbar back: She exhibits decreased range of motion (secondary to pain), tenderness (right paraspinal) and spasm. She exhibits no swelling.  Neurological: She is alert and oriented to person, place, and time. She has normal strength.  Reflex Scores:      Patellar reflexes are 2+ on the right side and 2+ on the left  side.      Achilles reflexes are 2+ on the right side and 2+ on the left side. Distal sensation intact  Psychiatric: She has a normal mood and affect. Her behavior is normal. Judgment and thought content normal.      Results for orders placed in visit on 04/09/13  POCT URINALYSIS DIPSTICK      Result Value Ref Range   Color, UA yellow     Clarity, UA slightly cloudy     Glucose, UA neg     Bilirubin, UA neg     Ketones, UA neg     Spec Grav, UA 1.025     Blood, UA neg     pH, UA 6.0     Protein, UA neg     Urobilinogen, UA 1.0     Nitrite, UA neg     Leukocytes, UA Negative    POCT UA - MICROSCOPIC ONLY      Result Value Ref Range   WBC, Ur, HPF, POC 0-1     RBC, urine, microscopic neg     Bacteria, U Microscopic neg     Mucus, UA neg     Epithelial cells, urine per micros 0-2     Crystals, Ur, HPF, POC neg     Casts, Ur, LPF, POC neg     Yeast, UA neg    POCT CBC      Result Value Ref Range   WBC 4.6  4.6 - 10.2 K/uL   Lymph, poc 2.2  0.6 - 3.4   POC LYMPH PERCENT 47.9  10 - 50 %L   MID (cbc) 0.4  0 - 0.9   POC MID % 7.7  0 - 12 %M   POC Granulocyte 2.0  2 - 6.9   Granulocyte percent 44.4  37 - 80 %G   RBC 4.79  4.04 - 5.48 M/uL   Hemoglobin 12.1 (*) 12.2 - 16.2 g/dL   HCT, POC 96.039.2  45.437.7 - 47.9 %   MCV 81.9  80 - 97 fL   MCH, POC 25.3 (*) 27 - 31.2 pg   MCHC 30.9 (*) 31.8 - 35.4 g/dL   RDW, POC 09.813.9     Platelet Count, POC 219  142 - 424 K/uL   MPV 10.4  0 - 99.8 fL   [facility] reading (PRIMARY) by  Dr. Milus GlazierLauenstein as no acute bony abnormality.      Assessment & Plan:  Back pain - Plan: DG Lumbar Spine Complete, POCT CBC, meloxicam (MOBIC) 7.5 MG tablet  Spasm of muscle - Plan: cyclobenzaprine (FLEXERIL) 5 MG tablet  Flank pain - Plan: POCT urinalysis dipstick, POCT UA - Microscopic Only  Will treat as musculoskeletal type pain with muscle spasm Flexeril 5 mg at bedtime - caution sedation Mobic 7.5 mg bid with food Recheck in 1-2 weeks if no improvement,  sooner if worse.

## 2013-05-19 ENCOUNTER — Other Ambulatory Visit: Payer: Self-pay | Admitting: Physician Assistant

## 2014-03-21 ENCOUNTER — Ambulatory Visit (INDEPENDENT_AMBULATORY_CARE_PROVIDER_SITE_OTHER): Payer: Managed Care, Other (non HMO) | Admitting: Internal Medicine

## 2014-03-21 ENCOUNTER — Ambulatory Visit (INDEPENDENT_AMBULATORY_CARE_PROVIDER_SITE_OTHER): Payer: Managed Care, Other (non HMO)

## 2014-03-21 VITALS — BP 132/80 | HR 76 | Temp 97.8°F | Resp 16 | Ht 66.0 in | Wt 221.8 lb

## 2014-03-21 DIAGNOSIS — R109 Unspecified abdominal pain: Secondary | ICD-10-CM

## 2014-03-21 DIAGNOSIS — E0590 Thyrotoxicosis, unspecified without thyrotoxic crisis or storm: Secondary | ICD-10-CM | POA: Insufficient documentation

## 2014-03-21 DIAGNOSIS — R102 Pelvic and perineal pain: Secondary | ICD-10-CM

## 2014-03-21 LAB — POCT URINALYSIS DIPSTICK
BILIRUBIN UA: NEGATIVE
Blood, UA: NEGATIVE
Glucose, UA: NEGATIVE
Ketones, UA: NEGATIVE
Nitrite, UA: NEGATIVE
Protein, UA: NEGATIVE
SPEC GRAV UA: 1.02
UROBILINOGEN UA: 0.2
pH, UA: 7.5

## 2014-03-21 LAB — POCT CBC
Granulocyte percent: 40.9 %G (ref 37–80)
HCT, POC: 42.6 % (ref 37.7–47.9)
Hemoglobin: 13.6 g/dL (ref 12.2–16.2)
LYMPH, POC: 2.8 (ref 0.6–3.4)
MCH, POC: 25.4 pg — AB (ref 27–31.2)
MCHC: 32 g/dL (ref 31.8–35.4)
MCV: 79.3 fL — AB (ref 80–97)
MID (cbc): 0.4 (ref 0–0.9)
MPV: 8.6 fL (ref 0–99.8)
PLATELET COUNT, POC: 192 10*3/uL (ref 142–424)
POC GRANULOCYTE: 2.2 (ref 2–6.9)
POC LYMPH %: 51 % — AB (ref 10–50)
POC MID %: 8.1 % (ref 0–12)
RBC: 5.36 M/uL (ref 4.04–5.48)
RDW, POC: 14 %
WBC: 5.5 10*3/uL (ref 4.6–10.2)

## 2014-03-21 LAB — POCT UA - MICROSCOPIC ONLY
CRYSTALS, UR, HPF, POC: NEGATIVE
Casts, Ur, LPF, POC: NEGATIVE
YEAST UA: NEGATIVE

## 2014-03-21 LAB — POCT WET PREP WITH KOH
CLUE CELLS WET PREP PER HPF POC: NEGATIVE
KOH Prep POC: NEGATIVE
Trichomonas, UA: NEGATIVE
Yeast Wet Prep HPF POC: NEGATIVE

## 2014-03-21 LAB — POCT URINE PREGNANCY: PREG TEST UR: NEGATIVE

## 2014-03-21 NOTE — Patient Instructions (Signed)
Please make sure you are drinking plenty of liquids and that your diet contains whole grains. Regular exercise can also help. Use OTC Miralax to help your bowels move and empty.

## 2014-03-21 NOTE — Progress Notes (Signed)
Subjective:    Patient ID: [name], female    DOB: [birth year], 44 y.o.   MRN: [id number]   PCP: No PCP Per Patient  Chief Complaint  Patient presents with  . Flank Pain    x this week, hurts with movement .  Marland Kitchen Vaginal Burning    reports that it feels like its on fire     No Known Allergies  Patient Active Problem List   Diagnosis Date Noted  . Hyperthyroidism 03/21/2014    Prior to Admission medications   Medication Sig Start Date End Date Taking? Authorizing Provider  acetaminophen (TYLENOL) 325 MG tablet Take 650 mg by mouth every 6 (six) hours as needed.   Yes Historical Provider, MD  methimazole (TAPAZOLE) 5 MG tablet Take 2.5 mg by mouth daily.   Yes Historical Provider, MD  CALCIUM PO Take 1 tablet by mouth daily.    Historical Provider, MD  Cholecalciferol (VITAMIN D PO) Take 1 capsule by mouth daily.    Historical Provider, MD  cyclobenzaprine (FLEXERIL) 5 MG tablet Take 1 tablet (5 mg total) by mouth at bedtime as needed for muscle spasms. Patient not taking: Reported on 03/21/2014 04/09/13   Nelva Nay, PA-C  meloxicam (MOBIC) 7.5 MG tablet Take 1 tablet (7.5 mg total) by mouth 2 (two) times daily. Take with food Patient not taking: Reported on 03/21/2014 04/09/13   Nelva Nay, PA-C    Medical, Surgical, Family and Social History reviewed and updated.  HPI  Reports 1 week of pain on the inside, RIGHT back, flank and pelvis. "Like on fire."  No pain with urination. No hematuria. A little bit of discharge with wiping. No nausea, vomiting or diarrhea. No fever, chills.  Pain with movement, even walking.  Review of Systems     Objective:   Physical Exam  Constitutional: She is oriented to person, place, and time. Vital signs are normal. She appears well-developed and well-nourished. She is active and cooperative. No distress.  BP 132/80 mmHg  Pulse 76  Temp(Src) 97.8 F (36.6 C) (Oral)  Resp 16  Ht  (1.676 m)  Wt 221 lb 12.8 oz (100.608  kg)  BMI 35.82 kg/m2  SpO2 98%  HENT:  Head: Normocephalic and atraumatic.  Right Ear: Hearing normal.  Left Ear: Hearing normal.  Eyes: Conjunctivae are normal. No scleral icterus.  Neck: Normal range of motion. Neck supple. No thyromegaly present.  Cardiovascular: Normal rate, regular rhythm and normal heart sounds.   Pulses:      Radial pulses are 2+ on the right side, and 2+ on the left side.  Pulmonary/Chest: Effort normal and breath sounds normal.  Abdominal: Normal appearance and bowel sounds are normal. She exhibits no distension, no fluid wave, no pulsatile midline mass and no mass. There is no hepatosplenomegaly. There is tenderness in the right upper quadrant, right lower quadrant, suprapubic area and left lower quadrant. There is CVA tenderness. There is no rigidity, no rebound, no guarding, no tenderness at McBurney's point and negative Murphy's sign. Hernia confirmed negative in the right inguinal area and confirmed negative in the left inguinal area.  Genitourinary: Vagina normal. Pelvic exam was performed with patient supine. There is labial fusion (anterior labial fusion procedure performed in [place] ). There is no rash, tenderness, lesion or injury on the right labia. There is no rash, tenderness, lesion or injury on the left labia. Cervix exhibits motion tenderness. Cervix exhibits no discharge and no friability. Right adnexum displays no  mass and no fullness. Left adnexum displays no mass and no fullness.  Due to body habitus, unable to palpate the adnexa or uterus, though those regions are quite tender on exam.  Musculoskeletal:       Arms: Tenderness of the RIGHT back and flank.  Lymphadenopathy:       Head (right side): No tonsillar, no preauricular, no posterior auricular and no occipital adenopathy present.       Head (left side): No tonsillar, no preauricular, no posterior auricular and no occipital adenopathy present.    She has no cervical adenopathy.       Right:  No inguinal and no supraclavicular adenopathy present.       Left: No inguinal and no supraclavicular adenopathy present.  Neurological: She is alert and oriented to person, place, and time. No sensory deficit.  Skin: Skin is warm, dry and intact. No rash noted. No cyanosis or erythema. Nails show no clubbing.  Psychiatric: She has a normal mood and affect. Her speech is normal and behavior is normal.     Results for orders placed or performed in visit on 03/21/14  POCT UA - Microscopic Only  Result Value Ref Range   WBC, Ur, HPF, POC 5-8    RBC, urine, microscopic 0-2    Bacteria, U Microscopic 1+    Mucus, UA 1+    Epithelial cells, urine per micros 3-6    Crystals, Ur, HPF, POC neg    Casts, Ur, LPF, POC neg    Yeast, UA neg   POCT urinalysis dipstick  Result Value Ref Range   Color, UA yellow    Clarity, UA hazy    Glucose, UA neg    Bilirubin, UA neg    Ketones, UA neg    Spec Grav, UA 1.020    Blood, UA neg    pH, UA 7.5    Protein, UA neg    Urobilinogen, UA 0.2    Nitrite, UA neg    Leukocytes, UA Trace   POCT CBC  Result Value Ref Range   WBC 5.5 4.6 - 10.2 K/uL   Lymph, poc 2.8 0.6 - 3.4   POC LYMPH PERCENT 51.0 (A) 10 - 50 %L   MID (cbc) 0.4 0 - 0.9   POC MID % 8.1 0 - 12 %M   POC Granulocyte 2.2 2 - 6.9   Granulocyte percent 40.9 37 - 80 %G   RBC 5.36 4.04 - 5.48 M/uL   Hemoglobin 13.6 12.2 - 16.2 g/dL   HCT, POC 16.1 09.6 - 47.9 %   MCV 79.3 (A) 80 - 97 fL   MCH, POC 25.4 (A) 27 - 31.2 pg   MCHC 32.0 31.8 - 35.4 g/dL   RDW, POC 04.5 %   Platelet Count, POC 192 142 - 424 K/uL   MPV 8.6 0 - 99.8 fL  POCT Wet Prep with KOH  Result Value Ref Range   Trichomonas, UA Negative    Clue Cells Wet Prep HPF POC neg    Epithelial Wet Prep HPF POC 2-10    Yeast Wet Prep HPF POC neg    Bacteria Wet Prep HPF POC trace    RBC Wet Prep HPF POC 0-1    WBC Wet Prep HPF POC 0-2    KOH Prep POC Negative   POCT urine pregnancy  Result Value Ref Range   Preg Test,  Ur Negative    KUB: [facility] reading (PRIMARY) by  Dr. Merla Riches. No bony  deformity. No evidence of obstruction. Large amount of stool throughout the colon.        Assessment & Plan:  1. Flank pain 3. Pelvic pain in female No evidence of nephrolithiasis or liver disease. Advise OTC Miralax, increased fluids, fiber and physical activity. If symptoms persist, RTC. May need CT scan. - POCT Wet Prep with KOH - POCT urine pregnancy - DG Abd 1 View; Future - POCT UA - Microscopic Only - POCT urinalysis dipstick - POCT CBC - DG Abd 1 View; Future  2. Hyperthyroidism Continue follow-up per endocrinology.    Fernande Brashelle S. Daniela Siebers, PA-C Physician Assistant-Certified [facility]  I have completed the patient encounter in its entirety as documented by the scribe, with editing by me where necessary. Robert P. Merla Richesoolittle, M.D.

## 2015-08-05 DIAGNOSIS — F458 Other somatoform disorders: Secondary | ICD-10-CM | POA: Insufficient documentation

## 2015-08-05 DIAGNOSIS — K219 Gastro-esophageal reflux disease without esophagitis: Secondary | ICD-10-CM | POA: Insufficient documentation

## 2015-08-05 DIAGNOSIS — R09A2 Foreign body sensation, throat: Secondary | ICD-10-CM | POA: Insufficient documentation

## 2015-08-05 DIAGNOSIS — E0590 Thyrotoxicosis, unspecified without thyrotoxic crisis or storm: Secondary | ICD-10-CM | POA: Insufficient documentation

## 2015-08-05 DIAGNOSIS — E049 Nontoxic goiter, unspecified: Secondary | ICD-10-CM | POA: Insufficient documentation

## 2015-08-10 ENCOUNTER — Other Ambulatory Visit: Payer: Self-pay | Admitting: Internal Medicine

## 2015-08-10 DIAGNOSIS — E213 Hyperparathyroidism, unspecified: Secondary | ICD-10-CM

## 2015-08-13 ENCOUNTER — Other Ambulatory Visit: Payer: Self-pay

## 2015-08-20 ENCOUNTER — Ambulatory Visit
Admission: RE | Admit: 2015-08-20 | Discharge: 2015-08-20 | Disposition: A | Discharge status: Home / Self Care | Payer: BLUE CROSS/BLUE SHIELD | Source: Ambulatory Visit | Attending: Internal Medicine | Admitting: Internal Medicine

## 2015-08-20 DIAGNOSIS — E213 Hyperparathyroidism, unspecified: Secondary | ICD-10-CM

## 2016-02-04 ENCOUNTER — Ambulatory Visit (INDEPENDENT_AMBULATORY_CARE_PROVIDER_SITE_OTHER): Payer: BLUE CROSS/BLUE SHIELD | Admitting: Family Medicine

## 2016-02-04 VITALS — BP 122/72 | HR 78 | Temp 98.2°F | Resp 17 | Ht 65.5 in | Wt 224.0 lb

## 2016-02-04 DIAGNOSIS — E6609 Other obesity due to excess calories: Secondary | ICD-10-CM

## 2016-02-04 DIAGNOSIS — Z6836 Body mass index (BMI) 36.0-36.9, adult: Secondary | ICD-10-CM | POA: Diagnosis not present

## 2016-02-04 DIAGNOSIS — K219 Gastro-esophageal reflux disease without esophagitis: Secondary | ICD-10-CM

## 2016-02-04 DIAGNOSIS — E66812 Obesity, class 2: Secondary | ICD-10-CM

## 2016-02-04 DIAGNOSIS — R1013 Epigastric pain: Secondary | ICD-10-CM

## 2016-02-04 MED ORDER — SIMETHICONE 80 MG PO CHEW: 80.0000 mg | CHEWABLE_TABLET | Freq: Four times a day (QID) | ORAL | 0 refills | Status: DC | PRN

## 2016-02-04 MED ORDER — PANTOPRAZOLE SODIUM 40 MG PO TBEC: 40.0000 mg | DELAYED_RELEASE_TABLET | Freq: Every day | ORAL | 3 refills | Status: DC

## 2016-02-04 NOTE — Progress Notes (Signed)
Chief Complaint  Patient presents with  . Abdominal Pain  . Emesis    HPI  Abdominal Pain Pt reports that she had pain on both side of her abdomen She reports that she has heart burn all night after dinner She reports that this used to happen before and she took the gas medication over the counter and it did not help. She reports that after vomiting she feels better She reports that this is her first time talking to a doctor about it Her triggers include eating at night or eating late. The pain inside is like something pinching her.  Weight gain seems to make it worse  Obesity Pt reports that she has been gaining weight She does not exercise She eats very late She does not limit her calories She eats most of her meals at home Wt Readings from Last 3 Encounters:  02/04/16 224 lb (101.6 kg)  03/21/14 221 lb 12.8 oz (100.6 kg)  04/09/13 213 lb (96.6 kg)   Body mass index is 36.71 kg/m.    Past Medical History:  Diagnosis Date  . Thyroid disease     Current Outpatient Prescriptions  Medication Sig Dispense Refill  . CALCIUM PO Take 1 tablet by mouth daily.    Marland Kitchen. acetaminophen (TYLENOL) 325 MG tablet Take 650 mg by mouth every 6 (six) hours as needed.    . Cholecalciferol (VITAMIN D PO) Take 1 capsule by mouth daily.    . pantoprazole (PROTONIX) 40 MG tablet Take 1 tablet (40 mg total) by mouth daily. 30 tablet 3  . simethicone (GAS-X) 80 MG chewable tablet Chew 1 tablet (80 mg total) by mouth every 6 (six) hours as needed for flatulence. 30 tablet 0   No current facility-administered medications for this visit.     Allergies: No Known Allergies  Past Surgical History:  Procedure Laterality Date  . DILATION AND CURETTAGE OF UTERUS      Social History   Social History  . Marital status: Married    Spouse name: Amedeo Goryli Hussein  . Number of children: 2  . Years of education: 9th grade   Occupational History  . Housekeeping    Social History Main Topics  .  Smoking status: Former Games developermoker  . Smokeless tobacco: Never Used  . Alcohol use No  . Drug use: No  . Sexual activity: Yes    Partners: Male   Other Topics Concern  . None   Social History Narrative   From [place]. Came to the [place] 2000. Lives with her husband and their two daughters.    Review of Systems  Constitutional: Negative for chills and fever.  Respiratory: Negative for cough, shortness of breath and wheezing.   Cardiovascular: Negative for chest pain, palpitations and leg swelling.  Gastrointestinal: Positive for abdominal pain, nausea and vomiting. Negative for blood in stool, constipation, diarrhea and melena.  Genitourinary: Negative for dysuria and urgency.  Skin: Negative for itching and rash.  Neurological: Negative for dizziness.  Psychiatric/Behavioral: Negative for depression. The patient is not nervous/anxious.     Objective: Vitals:   02/04/16 1206  BP: 122/72  Pulse: 78  Resp: 17  Temp: 98.2 F (36.8 C)  TempSrc: Oral  SpO2: 97%  Weight: 224 lb (101.6 kg)  Height: 5' 5.5" (1.664 m)    Physical Exam  Constitutional: She is oriented to person, place, and time. She appears well-developed and well-nourished.  HENT:  Head: Normocephalic and atraumatic.  Eyes: Conjunctivae and EOM are normal.  Neck:  Normal range of motion. Neck supple.  Cardiovascular: Normal rate, regular rhythm and normal heart sounds.   No murmur heard. Pulmonary/Chest: Effort normal and breath sounds normal. No respiratory distress. She has no wheezes.  Abdominal: Soft. Bowel sounds are normal. She exhibits distension and mass. There is tenderness. There is rebound and guarding. No hernia.  Musculoskeletal: Normal range of motion. She exhibits no edema.  Neurological: She is alert and oriented to person, place, and time.  Skin: Skin is warm. Capillary refill takes less than 2 seconds.  Psychiatric: She has a normal mood and affect. Her behavior is normal. Judgment and thought  content normal.    Assessment and Plan Ashaunte was seen today for abdominal pain and emesis.  Diagnoses and all orders for this visit:  Gastroesophageal reflux disease without esophagitis Abdominal pain, epigastric  Pain is consistent with previous episode of GERD Discussed that she should try gas x and protonix  If protonix is helpful then after completing a one month course will start omeprazole for maintenance treatment -     pantoprazole (PROTONIX) 40 MG tablet; Take 1 tablet (40 mg total) by mouth daily. -     simethicone (GAS-X) 80 MG chewable tablet; Chew 1 tablet (80 mg total) by mouth every 6 (six) hours as needed for flatulence.  Obesity -  Discussed weight loss Discussed impact of increased abdominal girth on reflux symptoms   Hilde Churchman A Creta Levin

## 2016-02-04 NOTE — Patient Instructions (Addendum)
IF you received an x-ray today, you will receive an invoice from [facility]. Please contact [facility] at [phone] with questions or concerns regarding your invoice.   IF you received labwork today, you will receive an invoice from [facility]. Please contact [facility] at [phone] with questions or concerns regarding your invoice.   Our billing staff will not be able to assist you with questions regarding bills from these companies.  You will be contacted with the lab results as soon as they are available. The fastest way to get your results is to activate your My Chart account. Instructions are located on the last page of this paperwork. If you have not heard from us regarding the results in 2 weeks, please contact this office.      Gastroesophageal Reflux Disease, Adult Introduction Normally, food travels down the esophagus and stays in the stomach to be digested. If a person has gastroesophageal reflux disease (GERD), food and stomach acid move back up into the esophagus. When this happens, the esophagus becomes sore and swollen (inflamed). Over time, GERD can make small holes (ulcers) in the lining of the esophagus. Follow these instructions at home: Diet  Follow a diet as told by your doctor. You may need to avoid foods and drinks such as:  Coffee and tea (with or without caffeine).  Drinks that contain alcohol.  Energy drinks and sports drinks.  Carbonated drinks or sodas.  Chocolate and cocoa.  Peppermint and mint flavorings.  Garlic and onions.  Horseradish.  Spicy and acidic foods, such as peppers, chili powder, curry powder, vinegar, hot sauces, and BBQ sauce.  Citrus fruit juices and citrus fruits, such as oranges, lemons, and limes.  Tomato-based foods, such as red sauce, chili, salsa, and pizza with red sauce.  Fried and fatty foods, such as donuts, french fries, potato chips, and high-fat dressings.  High-fat meats, such as hot  dogs, rib eye steak, sausage, ham, and bacon.  High-fat dairy items, such as whole milk, butter, and cream cheese.  Eat small meals often. Avoid eating large meals.  Avoid drinking large amounts of liquid with your meals.  Avoid eating meals during the 2-3 hours before bedtime.  Avoid lying down right after you eat.  Do not exercise right after you eat. General instructions  Pay attention to any changes in your symptoms.  Take over-the-counter and prescription medicines only as told by your doctor. Do not take aspirin, ibuprofen, or other NSAIDs unless your doctor says it is okay.  Do not use any tobacco products, including cigarettes, chewing tobacco, and e-cigarettes. If you need help quitting, ask your doctor.  Wear loose clothes. Do not wear anything tight around your waist.  Raise (elevate) the head of your bed about 6 inches (15 cm).  Try to lower your stress. If you need help doing this, ask your doctor.  If you are overweight, lose an amount of weight that is healthy for you. Ask your doctor about a safe weight loss goal.  Keep all follow-up visits as told by your doctor. This is important. Contact a doctor if:  You have new symptoms.  You lose weight and you do not know why it is happening.  You have trouble swallowing, or it hurts to swallow.  You have wheezing or a cough that keeps happening.  Your symptoms do not get better with treatment.  You have a hoarse voice. Get help right away if:  You have pain in your arms, neck, jaw,  teeth, or back.  You feel sweaty, dizzy, or light-headed.  You have chest pain or shortness of breath.  You throw up (vomit) and your throw up looks like blood or coffee grounds.  You pass out (faint).  Your poop (stool) is bloody or black.  You cannot swallow, drink, or eat. This information is not intended to replace advice given to you by your health care provider. Make sure you discuss any questions you have with your  health care provider. Document Released: 07/05/2007 Document Revised: 06/24/2015 Document Reviewed: 05/13/2014  2017 Elsevier

## 2016-03-10 ENCOUNTER — Ambulatory Visit (INDEPENDENT_AMBULATORY_CARE_PROVIDER_SITE_OTHER): Payer: BLUE CROSS/BLUE SHIELD | Admitting: Family Medicine

## 2016-03-10 VITALS — BP 118/74 | HR 74 | Temp 98.2°F | Resp 18 | Ht 65.5 in | Wt 220.0 lb

## 2016-03-10 DIAGNOSIS — Z6836 Body mass index (BMI) 36.0-36.9, adult: Secondary | ICD-10-CM

## 2016-03-10 DIAGNOSIS — K219 Gastro-esophageal reflux disease without esophagitis: Secondary | ICD-10-CM | POA: Diagnosis not present

## 2016-03-10 DIAGNOSIS — E6609 Other obesity due to excess calories: Secondary | ICD-10-CM

## 2016-03-10 DIAGNOSIS — R7303 Prediabetes: Secondary | ICD-10-CM

## 2016-03-10 DIAGNOSIS — R202 Paresthesia of skin: Secondary | ICD-10-CM | POA: Diagnosis not present

## 2016-03-10 LAB — POCT CBC
Granulocyte percent: 47.6 %G (ref 37–80)
HCT, POC: 38.9 % (ref 37.7–47.9)
Hemoglobin: 13.2 g/dL (ref 12.2–16.2)
LYMPH, POC: 2.7 (ref 0.6–3.4)
MCH, POC: 26.7 pg — AB (ref 27–31.2)
MCHC: 34 g/dL (ref 31.8–35.4)
MCV: 78.7 fL — AB (ref 80–97)
MID (cbc): 0.4 (ref 0–0.9)
MPV: 8.7 fL (ref 0–99.8)
PLATELET COUNT, POC: 203 10*3/uL (ref 142–424)
POC Granulocyte: 2.8 (ref 2–6.9)
POC LYMPH %: 46.4 % (ref 10–50)
POC MID %: 6 %M (ref 0–12)
RBC: 4.95 M/uL (ref 4.04–5.48)
RDW, POC: 13.8 %
WBC: 5.9 10*3/uL (ref 4.6–10.2)

## 2016-03-10 LAB — POCT GLYCOSYLATED HEMOGLOBIN (HGB A1C): Hemoglobin A1C: 5.9

## 2016-03-10 MED ORDER — SIMETHICONE 40 MG/0.6ML PO LIQD: 80.0000 mg | Freq: Four times a day (QID) | ORAL | 6 refills | Status: DC | PRN

## 2016-03-10 MED ORDER — FLUTICASONE PROPIONATE 50 MCG/ACT NA SUSP: 2.0000 | Freq: Every day | NASAL | 6 refills | Status: DC

## 2016-03-10 NOTE — Progress Notes (Deleted)
  Chief Complaint  Patient presents with  . Follow-up    reflux    HPI Onset of symptoms 3 ago with progressive symptoms of upset stomach no nausea but reports stomach churning also no diarrhea but reports loose stools once a day.   There isnt history of asthma There are no sick contacts Patient had a flu vaccine   He reports that he also has head congestion for 3 days He has lost his voice and is now hoarse  Past Medical History:  Diagnosis Date  . Thyroid disease     Current Outpatient Prescriptions  Medication Sig Dispense Refill  . acetaminophen (TYLENOL) 325 MG tablet Take 650 mg by mouth every 6 (six) hours as needed.    Marland Kitchen. CALCIUM PO Take 1 tablet by mouth daily.    . Cholecalciferol (VITAMIN D PO) Take 1 capsule by mouth daily.    . pantoprazole (PROTONIX) 40 MG tablet Take 1 tablet (40 mg total) by mouth daily. 30 tablet 3  . simethicone (GAS-X) 80 MG chewable tablet Chew 1 tablet (80 mg total) by mouth every 6 (six) hours as needed for flatulence. 30 tablet 0  . fluticasone (FLONASE) 50 MCG/ACT nasal spray Place 2 sprays into both nostrils daily. 16 g 6   No current facility-administered medications for this visit.     Allergies: No Known Allergies  Past Surgical History:  Procedure Laterality Date  . DILATION AND CURETTAGE OF UTERUS      Social History   Social History  . Marital status: Married    Spouse name: Amedeo Goryli Hussein  . Number of children: 2  . Years of education: 9th grade   Occupational History  . Housekeeping    Social History Main Topics  . Smoking status: Former Games developermoker  . Smokeless tobacco: Never Used  . Alcohol use No  . Drug use: No  . Sexual activity: Yes    Partners: Male   Other Topics Concern  . None   Social History Narrative   From [place]. Came to the [place] 2000. Lives with her husband and their two daughters.    ROS  Objective: Vitals:   03/10/16 1606  BP: 118/74  Pulse: 74  Resp: 18  Temp: 98.2 F (36.8 C)    TempSrc: Oral  SpO2: 99%  Weight: 220 lb (99.8 kg)  Height: 5' 5.5" (1.664 m)    Physical Exam General: alert, oriented, in NAD Head: normocephalic, atraumatic, no sinus tenderness Eyes: EOM intact, no scleral icterus or conjunctival injection Ears: TM clear bilaterally Throat: no pharyngeal exudate or erythema Lymph: no posterior auricular, submental or cervical lymph adenopathy Heart: normal rate, normal sinus rhythm, no murmurs Lungs: clear to auscultation bilaterally, no wheezing   Assessment and Plan Harshitha was seen today for follow-up.  Diagnoses and all orders for this visit:  Acute URI-  Discussed viral etiology Discussed otc decongestant Advised flonase for congestion  Increase hydration Calcium and zinc lozenges suggested Return to clinic if symptoms worse  Other orders -     fluticasone (FLONASE) 50 MCG/ACT nasal spray; Place 2 sprays into both nostrils daily.     Luanna Weesner A Nolen Lindamood

## 2016-03-10 NOTE — Progress Notes (Addendum)
Chief Complaint  Patient presents with  . Follow-up    reflux    HPI   GERD: Pt is here to follow up for her GERD States that the pantoprazole 40mg  is helping her reflux and she has less gas and bloating. She takes the Gas relief simethicone for flatulence. She reports that she has less bloating, belching and epigastric pain.  She states that she would like refills.  Hyperthyroidism She reports that her thyroid has been abnormal in the past and follows with Endocrinology She has follow up appt in 5 months She denies palpitations and tachycardia Paresthesia 3 days of burning pain in feet and fingers She also notices a pain in her feet that is like something inside her feet feels hot She reports that she worries about diabetes She reports that she   Wt Readings from Last 3 Encounters:  03/10/16 220 lb (99.8 kg)  02/04/16 224 lb (101.6 kg)  03/21/14 221 lb 12.8 oz (100.6 kg)     Past Medical History:  Diagnosis Date  . Thyroid disease     Current Outpatient Prescriptions  Medication Sig Dispense Refill  . acetaminophen (TYLENOL) 325 MG tablet Take 650 mg by mouth every 6 (six) hours as needed.    Marland Kitchen. CALCIUM PO Take 1 tablet by mouth daily.    . Cholecalciferol (VITAMIN D PO) Take 1 capsule by mouth daily.    . pantoprazole (PROTONIX) 40 MG tablet Take 1 tablet (40 mg total) by mouth daily. 30 tablet 3  . fluticasone (FLONASE) 50 MCG/ACT nasal spray Place 2 sprays into both nostrils daily. 16 g 6  . Simethicone 40 MG/0.6ML LIQD Take 1.2 mLs (80 mg total) by mouth every 6 (six) hours as needed. 30 mL 6   No current facility-administered medications for this visit.     Allergies: No Known Allergies  Past Surgical History:  Procedure Laterality Date  . DILATION AND CURETTAGE OF UTERUS      Social History   Social History  . Marital status: Married    Spouse name: [name]  . Number of children: 2  . Years of education: 9th grade   Occupational History  .  Housekeeping    Social History Main Topics  . Smoking status: Former Games developermoker  . Smokeless tobacco: Never Used  . Alcohol use No  . Drug use: No  . Sexual activity: Yes    Partners: Male   Other Topics Concern  . None   Social History Narrative   From [place]. Came to the [place] 2000. Lives with her husband and their two daughters.    ROS  Objective: Vitals:   03/10/16 1606  BP: 118/74  Pulse: 74  Resp: 18  Temp: 98.2 F (36.8 C)  TempSrc: Oral  SpO2: 99%  Weight: 220 lb (99.8 kg)  Height: 5' 5.5" (1.664 m)    Physical Exam  Assessment and Plan [name] was seen today for follow-up.  Diagnoses and all orders for this visit:  Gastroesophageal reflux disease without esophagitis - continue protonix Changed simethicone to liquid Continue weight loss and diet modification  Class 2 obesity due to excess calories without serious comorbidity with body mass index (BMI) of 36.0 to 36.9 in adult - improved with exercise  Paresthesia-  Discussed underlying causes and will await thyroid result -     POCT CBC -     POCT glycosylated hemoglobin (Hb A1C) -     TSH -     T4, Free  Prediabetes-  discussed a1c results Continue weight loss  Other orders -     fluticasone (FLONASE) 50 MCG/ACT nasal spray; Place 2 sprays into both nostrils daily. -     Simethicone 40 MG/0.6ML LIQD; Take 1.2 mLs (80 mg total) by mouth every 6 (six) hours as needed.     [name]

## 2016-03-10 NOTE — Patient Instructions (Addendum)
Paresthesia Introduction Paresthesia is an abnormal burning or prickling sensation. This sensation is generally felt in the hands, arms, legs, or feet. However, it may occur in any part of the body. Usually, it is not painful. The feeling may be described as:  Tingling or numbness.  Pins and needles.  Skin crawling.  Buzzing.  Limbs falling asleep.  Itching. Most people experience temporary (transient) paresthesia at some time in their lives. Paresthesia may occur when you breathe too quickly (hyperventilation). It can also occur without any apparent cause. Commonly, paresthesia occurs when pressure is placed on a nerve. The sensation quickly goes away after the pressure is removed. For some people, however, paresthesia is a long-lasting (chronic) condition that is caused by an underlying disorder. If you continue to have paresthesia, you may need further medical evaluation. Follow these instructions at home: Watch your condition for any changes. Taking the following actions may help to lessen any discomfort that you are feeling:  Avoid drinking alcohol.  Try acupuncture or massage to help relieve your symptoms.  Keep all follow-up visits as directed by your health care provider. This is important. Contact a health care provider if:  You continue to have episodes of paresthesia.  Your burning or prickling feeling gets worse when you walk.  You have pain, cramps, or dizziness.  You develop a rash. Get help right away if:  You feel weak.  You have trouble walking or moving.  You have problems with speech, understanding, or vision.  You feel confused.  You cannot control your bladder or bowel movements.  You have numbness after an injury.  You faint. This information is not intended to replace advice given to you by your health care provider. Make sure you discuss any questions you have with your health care provider. Document Released: 01/06/2002 Document Revised:  06/24/2015 Document Reviewed: 01/12/2014  2017 Elsevier  

## 2016-03-10 NOTE — Addendum Note (Signed)
Addended by: Collie SiadSTALLINGS, Kolina Kube A on: 03/10/2016 07:01 PM   Modules accepted: Orders, Level of Service

## 2016-03-11 LAB — T4, FREE: FREE T4: 1.11 ng/dL (ref 0.82–1.77)

## 2016-03-11 LAB — TSH: TSH: 4.77 u[IU]/mL — ABNORMAL HIGH (ref 0.450–4.500)

## 2016-03-13 ENCOUNTER — Telehealth: Payer: Self-pay

## 2016-03-13 NOTE — Telephone Encounter (Signed)
Needs pa for fluticasone  Key [id number] Cover my meds

## 2016-03-14 ENCOUNTER — Other Ambulatory Visit: Payer: Self-pay | Admitting: Family Medicine

## 2016-03-14 MED ORDER — LEVOTHYROXINE SODIUM 25 MCG PO TABS: 25.0000 ug | ORAL_TABLET | Freq: Every day | ORAL | 3 refills | Status: DC

## 2016-03-14 NOTE — Telephone Encounter (Signed)
Please tell the patient that her insurance is not covering it so she should just buy it over the counter at [facility].

## 2016-03-14 NOTE — Telephone Encounter (Signed)
Pa form for provider in mailbox stallings

## 2016-03-14 NOTE — Telephone Encounter (Signed)
Pt advised.

## 2016-03-16 ENCOUNTER — Telehealth: Payer: Self-pay | Admitting: Emergency Medicine

## 2016-03-16 NOTE — Telephone Encounter (Signed)
-----   Message from Doristine BosworthZoe A Stallings, MD sent at 03/14/2016 11:29 AM EST ----- Please let this patient know that she has hypothyroidism.  She should take levothyroxine 25mcg daily 30 to an hour minutes before her breakfast on an empty stomach. She should wash it down with plenty of water.  The thyroid issue could be the cause of the tingling in legs.  She should return for appointment in 3 months.

## 2016-05-23 ENCOUNTER — Ambulatory Visit (INDEPENDENT_AMBULATORY_CARE_PROVIDER_SITE_OTHER): Payer: BLUE CROSS/BLUE SHIELD | Admitting: Physician Assistant

## 2016-05-23 VITALS — BP 123/82 | HR 70 | Temp 98.1°F | Resp 18 | Ht 65.5 in | Wt 222.0 lb

## 2016-05-23 DIAGNOSIS — E039 Hypothyroidism, unspecified: Secondary | ICD-10-CM | POA: Insufficient documentation

## 2016-05-23 DIAGNOSIS — J309 Allergic rhinitis, unspecified: Secondary | ICD-10-CM

## 2016-05-23 MED ORDER — CETIRIZINE HCL 10 MG PO TABS: 10.0000 mg | ORAL_TABLET | Freq: Every day | ORAL | 1 refills | Status: DC

## 2016-05-23 MED ORDER — FLUTICASONE PROPIONATE 50 MCG/ACT NA SUSP: 2.0000 | Freq: Every day | NASAL | 6 refills | Status: DC

## 2016-05-23 NOTE — Patient Instructions (Addendum)
For your thyroid, I would like you to pick up your refill at the pharmacy. You will be given a 3 month prescription when you pick this up. I would like you to return to clinic in 4 weeks for lab testing of your thyroid and other additional labs. We will do a complete physical exam at this visit so make sure you are fasting at this visit.  For your allergies, please start taking flonase and zyrtec daily. We will reevaluate your symptoms at your follow up visit.   Allergic Rhinitis Allergic rhinitis is when the mucous membranes in the nose respond to allergens. Allergens are particles in the air that cause your body to have an allergic reaction. This causes you to release allergic antibodies. Through a chain of events, these eventually cause you to release histamine into the blood stream. Although meant to protect the body, it is this release of histamine that causes your discomfort, such as frequent sneezing, congestion, and an itchy, runny nose. What are the causes? Seasonal allergic rhinitis (hay fever) is caused by pollen allergens that may come from grasses, trees, and weeds. Year-round allergic rhinitis (perennial allergic rhinitis) is caused by allergens such as house dust mites, pet dander, and mold spores. What are the signs or symptoms?  Nasal stuffiness (congestion).  Itchy, runny nose with sneezing and tearing of the eyes. How is this diagnosed? Your health care provider can help you determine the allergen or allergens that trigger your symptoms. If you and your health care provider are unable to determine the allergen, skin or blood testing may be used. Your health care provider will diagnose your condition after taking your health history and performing a physical exam. Your health care provider may assess you for other related conditions, such as asthma, pink eye, or an ear infection. How is this treated? Allergic rhinitis does not have a cure, but it can be controlled by:  Medicines  that block allergy symptoms. These may include allergy shots, nasal sprays, and oral antihistamines.  Avoiding the allergen. Hay fever may often be treated with antihistamines in pill or nasal spray forms. Antihistamines block the effects of histamine. There are over-the-counter medicines that may help with nasal congestion and swelling around the eyes. Check with your health care provider before taking or giving this medicine. If avoiding the allergen or the medicine prescribed do not work, there are many new medicines your health care provider can prescribe. Stronger medicine may be used if initial measures are ineffective. Desensitizing injections can be used if medicine and avoidance does not work. Desensitization is when a patient is given ongoing shots until the body becomes less sensitive to the allergen. Make sure you follow up with your health care provider if problems continue. Follow these instructions at home: It is not possible to completely avoid allergens, but you can reduce your symptoms by taking steps to limit your exposure to them. It helps to know exactly what you are allergic to so that you can avoid your specific triggers. Contact a health care provider if:  You have a fever.  You develop a cough that does not stop easily (persistent).  You have shortness of breath.  You start wheezing.  Symptoms interfere with normal daily activities. This information is not intended to replace advice given to you by your health care provider. Make sure you discuss any questions you have with your health care provider. Document Released: 10/11/2000 Document Revised: 09/17/2015 Document Reviewed: 09/23/2012 Elsevier Interactive Patient Education  2017  [employer].     IF you received an x-ray today, you will receive an invoice from [facility]. Please contact [facility] at [phone] with questions or concerns regarding your invoice.   IF you received labwork  today, you will receive an invoice from [facility]. Please contact [facility] at [phone] with questions or concerns regarding your invoice.   Our billing staff will not be able to assist you with questions regarding bills from these companies.  You will be contacted with the lab results as soon as they are available. The fastest way to get your results is to activate your My Chart account. Instructions are located on the last page of this paperwork. If you have not heard from [place] regarding the results in 2 weeks, please contact this office.

## 2016-05-23 NOTE — Progress Notes (Signed)
[name]  MRN: [id number] DOB: 1970-08-27  Subjective:  [name] is a 46 y.o. female seen in office today for a chief complaint of medication refill for synthroid.  Has had diagnosis of hypothyroidism for about 5 years. Controlled on synthroid .  She has been out for over a month. Was seen by Dr. Creta Levin in February 2018 for follow up on thyroid. Took medication for a month and did not realize she had refills on the medication. Has associated headache, chills, cold intolerance, and heat intolerance. Notes these are similar symptoms she experienced when she was first diagnosed with hypothyroidism. Denies chest pain, palpitations, nausea, vomiting, dry skin, hair loss, and constipation.   Pt also concerned about seasonal allergies. Notes she has been experiencing sneezing, nasal congestion, and itchy watery eyes for the past few weeks. Has not tried anything for relief.   There is a clear language barrier. Pt's daughter is assisting with translation when needed.  Review of Systems  Constitutional: Negative for diaphoresis and fatigue.  HENT: Positive for congestion and sneezing.   Eyes: Negative for visual disturbance.  Respiratory: Negative for cough and shortness of breath.   Endocrine: Positive for cold intolerance and heat intolerance.  Allergic/Immunologic: Positive for environmental allergies.  Neurological: Negative for dizziness, tremors, facial asymmetry and light-headedness.    Patient Active Problem List   Diagnosis Date Noted  . Hyperthyroidism 03/21/2014    Current Outpatient Prescriptions on File Prior to Visit  Medication Sig Dispense Refill  . acetaminophen (TYLENOL) 325 MG tablet Take 650 mg by mouth every 6 (six) hours as needed.    Marland Kitchen CALCIUM PO Take 1 tablet by mouth daily.    . Cholecalciferol (VITAMIN D PO) Take 1 capsule by mouth daily.    Marland Kitchen levothyroxine (SYNTHROID, LEVOTHROID) 25 MCG tablet Take 1 tablet (25 mcg total) by mouth daily before breakfast. 30  tablet 3  . fluticasone (FLONASE) 50 MCG/ACT nasal spray Place 2 sprays into both nostrils daily. (Patient not taking: Reported on 05/23/2016) 16 g 6  . pantoprazole (PROTONIX) 40 MG tablet Take 1 tablet (40 mg total) by mouth daily. (Patient not taking: Reported on 05/23/2016) 30 tablet 3  . Simethicone 40 MG/0.6ML LIQD Take 1.2 mLs (80 mg total) by mouth every 6 (six) hours as needed. (Patient not taking: Reported on 05/23/2016) 30 mL 6   No current facility-administered medications on file prior to visit.     No Known Allergies     Social History   Social History  . Marital status: Married    Spouse name: Jaysie Benthall  . Number of children: 2  . Years of education: 9th grade   Occupational History  . Housekeeping    Social History Main Topics  . Smoking status: Current Every Day Smoker  . Smokeless tobacco: Never Used  . Alcohol use No  . Drug use: No  . Sexual activity: Yes    Partners: Male   Other Topics Concern  . Not on file   Social History Narrative   From [place]. Came to the [place] 2000. Lives with her husband and their two daughters.    Objective:  BP 123/82   Pulse 70   Temp 98.1 F (36.7 C) (Oral)   Resp 18   Ht 5' 5.5" (1.664 m)   Wt 222 lb (100.7 kg)   SpO2 100%   BMI 36.38 kg/m   Physical Exam  Constitutional: She is oriented to person, place, and time and well-developed, well-nourished, and  in no distress.  HENT:  Head: Normocephalic and atraumatic.  Right Ear: External ear and ear canal normal. Tympanic membrane is retracted.  Left Ear: External ear and ear canal normal. Tympanic membrane is retracted.  Nose: Mucosal edema (mild bilaterally ) present.  Mouth/Throat: Uvula is midline and mucous membranes are normal. Posterior oropharyngeal erythema present.  Eyes: Conjunctivae are normal.  Neck: Normal range of motion and full passive range of motion without pain.  Cardiovascular: Normal rate, regular rhythm and normal heart sounds.     Pulmonary/Chest: Effort normal and breath sounds normal.  Lymphadenopathy:       Head (right side): No submental, no submandibular, no tonsillar, no preauricular, no posterior auricular and no occipital adenopathy present.       Head (left side): No submental, no submandibular, no tonsillar, no preauricular, no posterior auricular and no occipital adenopathy present.    She has no cervical adenopathy.       Right: No supraclavicular adenopathy present.       Left: No supraclavicular adenopathy present.  Neurological: She is alert and oriented to person, place, and time. She has normal strength. She displays no tremor. She has a normal Finger-Nose-Finger Test, a normal Heel to [employer], a normal Romberg Test and a normal Tandem Gait Test. Gait normal. Gait normal.  Reflex Scores:      Tricep reflexes are 2+ on the right side and 2+ on the left side.      Bicep reflexes are 2+ on the right side and 2+ on the left side.      Brachioradialis reflexes are 2+ on the right side and 2+ on the left side.      Patellar reflexes are 2+ on the right side and 2+ on the left side.      Achilles reflexes are 2+ on the right side and 2+ on the left side. Skin: Skin is warm and dry.  Psychiatric: Affect normal.  Vitals reviewed.   Assessment and Plan :   1. Allergic rhinitis, unspecified seasonality, unspecified trigger PE consistent with allergic rhinitis, pt given info on allergic rhinitis. Instructed to use flonase and zyrtec daily, will reevaluate at follow up appointment in one week.  - fluticasone (FLONASE) 50 MCG/ACT nasal spray; Place 2 sprays into both nostrils daily.  Dispense: 16 g; Refill: 6 - cetirizine (ZYRTEC) 10 MG tablet; Take 1 tablet (10 mg total) by mouth daily.  Dispense: 90 tablet; Refill: 1  2. Hypothyroidism, unspecified type Uncontrolled as patient has been off medication for > 1 month. She is symptomatic. PE findings are reassuring. Contacted pharmacy and had them consolidate  all remaining refills of synthroid into one 90 day prescription for the patient. Instructed for her to go pick up the medication and begin taking it today. Plan to follow up in one week for CPE and reevaluation of hypothyroidism. Will obtain TSH at this time while patient is on the medication.   Benjiman Core PA-C  [facility] 05/23/2016 4:55 PM

## 2016-05-30 ENCOUNTER — Encounter: Payer: Self-pay | Admitting: Physician Assistant

## 2016-05-30 ENCOUNTER — Telehealth: Payer: Self-pay

## 2016-05-30 ENCOUNTER — Ambulatory Visit (INDEPENDENT_AMBULATORY_CARE_PROVIDER_SITE_OTHER): Payer: BLUE CROSS/BLUE SHIELD | Admitting: Physician Assistant

## 2016-05-30 VITALS — BP 116/79 | HR 86 | Temp 98.5°F | Resp 17 | Ht 65.5 in | Wt 223.0 lb

## 2016-05-30 DIAGNOSIS — Z130 Encounter for screening for diseases of the blood and blood-forming organs and certain disorders involving the immune mechanism: Secondary | ICD-10-CM

## 2016-05-30 DIAGNOSIS — Z1239 Encounter for other screening for malignant neoplasm of breast: Secondary | ICD-10-CM

## 2016-05-30 DIAGNOSIS — Z13228 Encounter for screening for other metabolic disorders: Secondary | ICD-10-CM | POA: Diagnosis not present

## 2016-05-30 DIAGNOSIS — Z13220 Encounter for screening for lipoid disorders: Secondary | ICD-10-CM

## 2016-05-30 DIAGNOSIS — Z114 Encounter for screening for human immunodeficiency virus [HIV]: Secondary | ICD-10-CM

## 2016-05-30 DIAGNOSIS — Z0000 Encounter for general adult medical examination without abnormal findings: Secondary | ICD-10-CM | POA: Diagnosis not present

## 2016-05-30 DIAGNOSIS — Z124 Encounter for screening for malignant neoplasm of cervix: Secondary | ICD-10-CM | POA: Diagnosis not present

## 2016-05-30 DIAGNOSIS — E039 Hypothyroidism, unspecified: Secondary | ICD-10-CM | POA: Diagnosis not present

## 2016-05-30 DIAGNOSIS — Z1231 Encounter for screening mammogram for malignant neoplasm of breast: Secondary | ICD-10-CM

## 2016-05-30 NOTE — Progress Notes (Signed)
[name]  MRN: [id number] DOB: [birth year]  Subjective:  Pt is a 46 y.o. female who presents for annual physical exam. Pt is not fasting today. She last ate 5 hours before arrival.   Social: She is from [place]. Has lived in [place] for [place] years. She is married with two children. She does housekeeping for a living.   Diet: Eats a variety of foods. Likes rice, spaghetti, pizza, some vegetables, lots of fruits. She drinks orange juice, cranberry juice, water, coffee, and tea.   Exercise: Pt does housekeeping for a job and thereofore is constatntly moving but does no structured exercise.  Menstrual cycle: Last menstrual cycle was ~5 years ago.   Sleep: Gets about 8-9 hours of sleep a night.   Bowel Movements: Has a bowel movement every few days.   Last annual exam: 2014 Last dental exam: 2017 Last vision exam: Never Last pap smear: > 10 years ago Last mammogram: Never   Vaccinations      Tetanus: >10 years ago, declines today      Chronic conditions: 1) Hypothyroidism: Has had dx for 5 years. Controlled on synthroid 89mg. Has enough medication to last 2 more months. No other concerns or complaints.   Patient Active Problem List   Diagnosis Date Noted  . Hypothyroidism 05/23/2016  . Hyperthyroidism 03/21/2014    Current Outpatient Prescriptions on File Prior to Visit  Medication Sig Dispense Refill  . acetaminophen (TYLENOL) 325 MG tablet Take 650 mg by mouth every 6 (six) hours as needed.    .Marland KitchenCALCIUM PO Take 1 tablet by mouth daily.    . cetirizine (ZYRTEC) 10 MG tablet Take 1 tablet (10 mg total) by mouth daily. 90 tablet 1  . Cholecalciferol (VITAMIN D PO) Take 1 capsule by mouth daily.    . fluticasone (FLONASE) 50 MCG/ACT nasal spray Place 2 sprays into both nostrils daily. 16 g 6  . levothyroxine (SYNTHROID, LEVOTHROID) 25 MCG tablet Take 1 tablet (25 mcg total) by mouth daily before breakfast. 30 tablet 3  . pantoprazole (PROTONIX) 40 MG tablet Take 1  tablet (40 mg total) by mouth daily. (Patient not taking: Reported on 05/23/2016) 30 tablet 3  . Simethicone 40 MG/0.6ML LIQD Take 1.2 mLs (80 mg total) by mouth every 6 (six) hours as needed. (Patient not taking: Reported on 05/23/2016) 30 mL 6   No current facility-administered medications on file prior to visit.     No Known Allergies  Social History   Social History  . Marital status: Married    Spouse name: ACachet Mccutchen . Number of children: 2  . Years of education: 9th grade   Occupational History  . Housekeeping    Social History Main Topics  . Smoking status: Former Smoker    Quit date: 05/30/2013  . Smokeless tobacco: Never Used  . Alcohol use No  . Drug use: No  . Sexual activity: Yes    Partners: Male   Other Topics Concern  . None   Social History Narrative   [place]. Lives with her husband and their two daughters.    Past Surgical History:  Procedure Laterality Date  . DILATION AND CURETTAGE OF UTERUS      No family history on file.  Review of Systems  Constitutional: Negative for activity change, appetite change, chills, diaphoresis, fatigue, fever and unexpected weight change.  HENT: Positive for congestion. Negative for dental problem, drooling, ear discharge, ear pain, facial swelling,  hearing loss, mouth sores, nosebleeds, postnasal drip, rhinorrhea, sinus pain, sinus pressure, sneezing, sore throat, tinnitus, trouble swallowing and voice change.   Eyes: Positive for itching. Negative for photophobia, pain, discharge, redness and visual disturbance.  Respiratory: Negative for apnea, cough, choking, chest tightness, shortness of breath, wheezing and stridor.   Cardiovascular: Negative for chest pain, palpitations and leg swelling.  Gastrointestinal: Negative for abdominal distention, abdominal pain, anal bleeding, blood in stool, constipation, diarrhea, nausea, rectal pain and vomiting.  Endocrine: Negative for cold intolerance,  heat intolerance, polydipsia, polyphagia and polyuria.  Genitourinary: Negative for decreased urine volume, difficulty urinating, dyspareunia, dysuria, enuresis, flank pain, frequency, genital sores, hematuria, menstrual problem, pelvic pain, urgency, vaginal bleeding, vaginal discharge and vaginal pain.  Musculoskeletal: Positive for back pain. Negative for arthralgias, gait problem, joint swelling, myalgias, neck pain and neck stiffness.  Skin: Negative for color change, pallor, rash and wound.  Allergic/Immunologic: Positive for environmental allergies. Negative for food allergies and immunocompromised state.  Neurological: Negative for dizziness, tremors, seizures, syncope, facial asymmetry, speech difficulty, weakness, light-headedness, numbness and headaches.  Hematological: Negative for adenopathy. Does not bruise/bleed easily.  Psychiatric/Behavioral: Negative for agitation, behavioral problems, confusion, decreased concentration, dysphoric mood, hallucinations, self-injury, sleep disturbance and suicidal ideas. The patient is not nervous/anxious and is not hyperactive.     Objective:  BP 116/79 (BP Location: Right Arm, Patient Position: Sitting, Cuff Size: Large)   Pulse 86   Temp 98.5 F (36.9 C) (Oral)   Resp 17   Ht 5' 5.5" (1.664 m)   Wt 223 lb (101.2 kg)   SpO2 99%   BMI 36.54 kg/m   Physical Exam  Constitutional: She is oriented to person, place, and time and well-developed, well-nourished, and in no distress.  HENT:  Head: Normocephalic and atraumatic.  Right Ear: Hearing, tympanic membrane, external ear and ear canal normal.  Left Ear: Hearing, tympanic membrane, external ear and ear canal normal.  Nose: Mucosal edema (bilateral, more prominent on right ) present.  Mouth/Throat: Uvula is midline, oropharynx is clear and moist and mucous membranes are normal. No oropharyngeal exudate.  Eyes: Conjunctivae, EOM and lids are normal. Pupils are equal, round, and reactive to  light. No scleral icterus.  Neck: Trachea normal and normal range of motion. No thyroid mass and no thyromegaly present.  Cardiovascular: Normal rate, regular rhythm, normal heart sounds and intact distal pulses.   Pulmonary/Chest: Effort normal and breath sounds normal. Right breast exhibits no inverted nipple, no mass, no nipple discharge, no skin change and no tenderness. Left breast exhibits no inverted nipple, no mass, no nipple discharge, no skin change and no tenderness. Breasts are symmetrical.  Abdominal: Soft. Normal appearance and bowel sounds are normal. There is no tenderness.  Genitourinary: Vagina normal, uterus normal, cervix normal, right adnexa normal, left adnexa normal and vulva normal.  Lymphadenopathy:       Head (right side): No tonsillar, no preauricular, no posterior auricular and no occipital adenopathy present.       Head (left side): No tonsillar, no preauricular, no posterior auricular and no occipital adenopathy present.    She has no cervical adenopathy.       Right: No supraclavicular adenopathy present.       Left: No supraclavicular adenopathy present.  Neurological: She is alert and oriented to person, place, and time. She has normal sensation, normal strength and normal reflexes. Gait normal.  Skin: Skin is warm and dry.  Psychiatric: Affect normal.     No exam data  present  Assessment and Plan :  Discussed healthy lifestyle, diet, exercise, preventative care, vaccinations, and addressed patient's concerns. Plan for follow up in 6 months. Otherwise, plan for specific conditions below.   1. Annual physical exam Await lab results  2. Screening, anemia, deficiency, iron - CBC with Differential/Platelet  3. Screening for metabolic disorder - [id number]  4. Screening, lipid - Lipid panel  5. Screening for HIV (human immunodeficiency virus) - HIV antibody  6. Screening for cervical cancer - Pap IG and HPV (high risk) DNA detection  7. Screening  for breast cancer -Given information to schedule mammogram.   8. Hypothyroidism, unspecified type -Depending on TSH results, will refill thryoid medciation for 6 months. If all is normal, plan for follow up in 6 months for repeat labs. - TSH   Tenna Delaine, PA-C  [facility] 05/30/2016 6:52 PM

## 2016-05-30 NOTE — Telephone Encounter (Signed)
[name],   I am seeing this patient today so I will ask her her thoughts on this as it is an OTC medication. Thanks!

## 2016-05-30 NOTE — Telephone Encounter (Signed)
[place] insurance denied claim for fluticasone 50 mcg nasal spray.  Would you like to order an alternative medication? If you want a PA to see if fluticasone is approved, this could take up to 5 business days. Just let me know what you want to do. [phone] plan Patient ID# [id number] Patient's pharmacy is [facility]  [phone]  Thanks, Charlies Constable

## 2016-05-30 NOTE — Patient Instructions (Addendum)
We will contact you in a few days with your lab results. Once I have your lab results I can refill your thyroid medication for at least 6 months. Lets plan to follow up in 6 months for repeat labs. If all is normal at this time, we can have you visit yearly for medication refill. In terms of your physical exam, everything looks great! Make sure you increase the amount of fiber in your diet to help with bowel movements. Also, please call the numbers listed below to schedule a mammogram as this is recommended yearly to screen for breast cancer. Thank you for letting me participate in your health and well being.     Health Maintenance for Postmenopausal Women Menopause is a normal process in which your reproductive ability comes to an end. This process happens gradually over a span of months to years, usually between the ages of 73 and 36. Menopause is complete when you have missed 12 consecutive menstrual periods. It is important to talk with your health care provider about some of the most common conditions that affect postmenopausal women, such as heart disease, cancer, and bone loss (osteoporosis). Adopting a healthy lifestyle and getting preventive care can help to promote your health and wellness. Those actions can also lower your chances of developing some of these common conditions. What should I know about menopause? During menopause, you may experience a number of symptoms, such as:  Moderate-to-severe hot flashes.  Night sweats.  Decrease in sex drive.  Mood swings.  Headaches.  Tiredness.  Irritability.  Memory problems.  Insomnia. Choosing to treat or not to treat menopausal changes is an individual decision that you make with your health care provider. What should I know about hormone replacement therapy and supplements? Hormone therapy products are effective for treating symptoms that are associated with menopause, such as hot flashes and night sweats. Hormone replacement  carries certain risks, especially as you become older. If you are thinking about using estrogen or estrogen with progestin treatments, discuss the benefits and risks with your health care provider. What should I know about heart disease and stroke? Heart disease, heart attack, and stroke become more likely as you age. This may be due, in part, to the hormonal changes that your body experiences during menopause. These can affect how your body processes dietary fats, triglycerides, and cholesterol. Heart attack and stroke are both medical emergencies. There are many things that you can do to help prevent heart disease and stroke:  Have your blood pressure checked at least every 1-2 years. High blood pressure causes heart disease and increases the risk of stroke.  If you are 65-62 years old, ask your health care provider if you should take aspirin to prevent a heart attack or a stroke.  Do not use any tobacco products, including cigarettes, chewing tobacco, or electronic cigarettes. If you need help quitting, ask your health care provider.  It is important to eat a healthy diet and maintain a healthy weight.  Be sure to include plenty of vegetables, fruits, low-fat dairy products, and lean protein.  Avoid eating foods that are high in solid fats, added sugars, or salt (sodium).  Get regular exercise. This is one of the most important things that you can do for your health.  Try to exercise for at least 150 minutes each week. The type of exercise that you do should increase your heart rate and make you sweat. This is known as moderate-intensity exercise.  Try to do strengthening exercises  at least twice each week. Do these in addition to the moderate-intensity exercise.  Know your numbers.Ask your health care provider to check your cholesterol and your blood glucose. Continue to have your blood tested as directed by your health care provider. What should I know about cancer screening? There are  several types of cancer. Take the following steps to reduce your risk and to catch any cancer development as early as possible. Breast Cancer  Practice breast self-awareness.  This means understanding how your breasts normally appear and feel.  It also means doing regular breast self-exams. Let your health care provider know about any changes, no matter how small.  If you are 60 or older, have a clinician do a breast exam (clinical breast exam or CBE) every year. Depending on your age, family history, and medical history, it may be recommended that you also have a yearly breast X-ray (mammogram).  If you have a family history of breast cancer, talk with your health care provider about genetic screening.  If you are at high risk for breast cancer, talk with your health care provider about having an MRI and a mammogram every year.  Breast cancer (BRCA) gene test is recommended for women who have family members with BRCA-related cancers. Results of the assessment will determine the need for genetic counseling and BRCA1 and for BRCA2 testing. BRCA-related cancers include these types:  Breast. This occurs in males or females.  Ovarian.  Tubal. This may also be called fallopian tube cancer.  Cancer of the abdominal or pelvic lining (peritoneal cancer).  Prostate.  Pancreatic. Cervical, Uterine, and Ovarian Cancer  Your health care provider may recommend that you be screened regularly for cancer of the pelvic organs. These include your ovaries, uterus, and vagina. This screening involves a pelvic exam, which includes checking for microscopic changes to the surface of your cervix (Pap test).  For women ages 21-65, health care providers may recommend a pelvic exam and a Pap test every three years. For women ages 76-65, they may recommend the Pap test and pelvic exam, combined with testing for human papilloma virus (HPV), every five years. Some types of HPV increase your risk of cervical cancer.  Testing for HPV may also be done on women of any age who have unclear Pap test results.  Other health care providers may not recommend any screening for nonpregnant women who are considered low risk for pelvic cancer and have no symptoms. Ask your health care provider if a screening pelvic exam is right for you.  If you have had past treatment for cervical cancer or a condition that could lead to cancer, you need Pap tests and screening for cancer for at least 20 years after your treatment. If Pap tests have been discontinued for you, your risk factors (such as having a new sexual partner) need to be reassessed to determine if you should start having screenings again. Some women have medical problems that increase the chance of getting cervical cancer. In these cases, your health care provider may recommend that you have screening and Pap tests more often.  If you have a family history of uterine cancer or ovarian cancer, talk with your health care provider about genetic screening.  If you have vaginal bleeding after reaching menopause, tell your health care provider.  There are currently no reliable tests available to screen for ovarian cancer. Lung Cancer  Lung cancer screening is recommended for adults 56-23 years old who are at high risk for lung cancer  because of a history of smoking. A yearly low-dose CT scan of the lungs is recommended if you:  Currently smoke.  Have a history of at least 30 pack-years of smoking and you currently smoke or have quit within the past 15 years. A pack-year is smoking an average of one pack of cigarettes per day for one year. Yearly screening should:  Continue until it has been 15 years since you quit.  Stop if you develop a health problem that would prevent you from having lung cancer treatment. Colorectal Cancer  This type of cancer can be detected and can often be prevented.  Routine colorectal cancer screening usually begins at age 37 and continues  through age 71.  If you have risk factors for colon cancer, your health care provider may recommend that you be screened at an earlier age.  If you have a family history of colorectal cancer, talk with your health care provider about genetic screening.  Your health care provider may also recommend using home test kits to check for hidden blood in your stool.  A small camera at the end of a tube can be used to examine your colon directly (sigmoidoscopy or colonoscopy). This is done to check for the earliest forms of colorectal cancer.  Direct examination of the colon should be repeated every 5-10 years until age 39. However, if early forms of precancerous polyps or small growths are found or if you have a family history or genetic risk for colorectal cancer, you may need to be screened more often. Skin Cancer  Check your skin from head to toe regularly.  Monitor any moles. Be sure to tell your health care provider:  About any new moles or changes in moles, especially if there is a change in a mole's shape or color.  If you have a mole that is larger than the size of a pencil eraser.  If any of your family members has a history of skin cancer, especially at a young age, talk with your health care provider about genetic screening.  Always use sunscreen. Apply sunscreen liberally and repeatedly throughout the day.  Whenever you are outside, protect yourself by wearing long sleeves, pants, a wide-brimmed hat, and sunglasses. What should I know about osteoporosis? Osteoporosis is a condition in which bone destruction happens more quickly than new bone creation. After menopause, you may be at an increased risk for osteoporosis. To help prevent osteoporosis or the bone fractures that can happen because of osteoporosis, the following is recommended:  If you are 54-26 years old, get at least 1,000 mg of calcium and at least 600 mg of vitamin D per day.  If you are older than age 95 but younger  than age 33, get at least 1,200 mg of calcium and at least 600 mg of vitamin D per day.  If you are older than age 81, get at least 1,200 mg of calcium and at least 800 mg of vitamin D per day. Smoking and excessive alcohol intake increase the risk of osteoporosis. Eat foods that are rich in calcium and vitamin D, and do weight-bearing exercises several times each week as directed by your health care provider. What should I know about how menopause affects my mental health? Depression may occur at any age, but it is more common as you become older. Common symptoms of depression include:  Low or sad mood.  Changes in sleep patterns.  Changes in appetite or eating patterns.  Feeling an overall lack of  motivation or enjoyment of activities that you previously enjoyed.  Frequent crying spells. Talk with your health care provider if you think that you are experiencing depression. What should I know about immunizations? It is important that you get and maintain your immunizations. These include:  Tetanus, diphtheria, and pertussis (Tdap) booster vaccine.  Influenza every year before the flu season begins.  Pneumonia vaccine.  Shingles vaccine. Your health care provider may also recommend other immunizations. This information is not intended to replace advice given to you by your health care provider. Make sure you discuss any questions you have with your health care provider. Document Released: 03/10/2005 Document Revised: 08/06/2015 Document Reviewed: 10/20/2014 Elsevier Interactive Patient Education  2017 Elsevier Inc.  Constipation, Adult Constipation is when a person:  Poops (has a bowel movement) fewer times in a week than normal.  Has a hard time pooping.  Has poop that is dry, hard, or bigger than normal. Follow these instructions at home: Eating and drinking    Eat foods that have a lot of fiber, such as:  Fresh fruits and vegetables.  Whole grains.  Beans.  Eat  less of foods that are high in fat, low in fiber, or overly processed, such as:  [place] fries.  Hamburgers.  Cookies.  Candy.  Soda.  Drink enough fluid to keep your pee (urine) clear or pale yellow. General instructions   Exercise regularly or as told by your doctor.  Go to the restroom when you feel like you need to poop. Do not hold it in.  Take over-the-counter and prescription medicines only as told by your doctor. These include any fiber supplements.  Do pelvic floor retraining exercises, such as:  Doing deep breathing while relaxing your lower belly (abdomen).  Relaxing your pelvic floor while pooping.  Watch your condition for any changes.  Keep all follow-up visits as told by your doctor. This is important. Contact a doctor if:  You have pain that gets worse.  You have a fever.  You have not pooped for 4 days.  You throw up (vomit).  You are not hungry.  You lose weight.  You are bleeding from the anus.  You have thin, pencil-like poop (stool). Get help right away if:  You have a fever, and your symptoms suddenly get worse.  You leak poop or have blood in your poop.  Your belly feels hard or bigger than normal (is bloated).  You have very bad belly pain.  You feel dizzy or you faint. This information is not intended to replace advice given to you by your health care provider. Make sure you discuss any questions you have with your health care provider. Document Released: 07/05/2007 Document Revised: 08/06/2015 Document Reviewed: 07/07/2015 Elsevier Interactive Patient Education  2017 [employer].    IF you received an x-ray today, you will receive an invoice from [facility]. Please contact [facility] at [phone] with questions or concerns regarding your invoice.   IF you received labwork today, you will receive an invoice from [facility]. Please contact [facility] at [phone] with questions or concerns regarding your  invoice.   Our billing staff will not be able to assist you with questions regarding bills from these companies.  You will be contacted with the lab results as soon as they are available. The fastest way to get your results is to activate your My Chart account. Instructions are located on the last page of this paperwork. If you have not heard from [place] regarding the  results in 2 weeks, please contact this office.      We recommend that you schedule a mammogram for breast cancer screening. Typically, you do not need a referral to do this. Please contact a local imaging center to schedule your mammogram.  [facility] - [phone]  *ask for the Radiology Department [facility] - [phone] or [phone]  [facility] - [phone] [place] [phone] [facility] - [phone]  *ask for the [facility] - [phone]  *ask for the Radiology Department [facility] - [phone]  *ask for the [facility] - [phone]

## 2016-05-31 LAB — HIV ANTIBODY (ROUTINE TESTING W REFLEX): HIV SCREEN 4TH GENERATION: NONREACTIVE

## 2016-05-31 LAB — CBC WITH DIFFERENTIAL/PLATELET
BASOS: 1 %
Basophils Absolute: 0 10*3/uL (ref 0.0–0.2)
EOS (ABSOLUTE): 0.1 10*3/uL (ref 0.0–0.4)
Eos: 2 %
Hematocrit: 38.9 % (ref 34.0–46.6)
Hemoglobin: 12.6 g/dL (ref 11.1–15.9)
Immature Grans (Abs): 0 10*3/uL (ref 0.0–0.1)
Immature Granulocytes: 0 %
Lymphocytes Absolute: 3 10*3/uL (ref 0.7–3.1)
Lymphs: 45 %
MCH: 25.6 pg — ABNORMAL LOW (ref 26.6–33.0)
MCHC: 32.4 g/dL (ref 31.5–35.7)
MCV: 79 fL (ref 79–97)
MONOS ABS: 0.3 10*3/uL (ref 0.1–0.9)
Monocytes: 4 %
Neutrophils Absolute: 3.2 10*3/uL (ref 1.4–7.0)
Neutrophils: 48 %
PLATELETS: 239 10*3/uL (ref 150–379)
RBC: 4.92 x10E6/uL (ref 3.77–5.28)
RDW: 14.2 % (ref 12.3–15.4)
WBC: 6.6 10*3/uL (ref 3.4–10.8)

## 2016-05-31 LAB — CMP14+EGFR
A/G RATIO: 1.4 (ref 1.2–2.2)
ALBUMIN: 4.3 g/dL (ref 3.5–5.5)
ALT: 20 IU/L (ref 0–32)
AST: 25 IU/L (ref 0–40)
Alkaline Phosphatase: 67 IU/L (ref 39–117)
BUN / CREAT RATIO: 21 (ref 9–23)
BUN: 14 mg/dL (ref 6–24)
Bilirubin Total: <0.2 mg/dL (ref 0.0–1.2)
CALCIUM: 9.5 mg/dL (ref 8.7–10.2)
CO2: 23 mmol/L (ref 18–29)
CREATININE: 0.67 mg/dL (ref 0.57–1.00)
Chloride: 103 mmol/L (ref 96–106)
GFR, EST AFRICAN AMERICAN: 123 mL/min/{1.73_m2} (ref >59)
GFR, EST NON AFRICAN AMERICAN: 106 mL/min/{1.73_m2} (ref >59)
GLOBULIN, TOTAL: 3 g/dL (ref 1.5–4.5)
Glucose: 101 mg/dL — ABNORMAL HIGH (ref 65–99)
POTASSIUM: 3.9 mmol/L (ref 3.5–5.2)
SODIUM: 142 mmol/L (ref 134–144)
TOTAL PROTEIN: 7.3 g/dL (ref 6.0–8.5)

## 2016-05-31 LAB — LIPID PANEL
CHOL/HDL RATIO: 3.8 ratio (ref 0.0–4.4)
Cholesterol, Total: 173 mg/dL (ref 100–199)
HDL: 46 mg/dL (ref >39)
LDL CALC: 90 mg/dL (ref 0–99)
Triglycerides: 183 mg/dL — ABNORMAL HIGH (ref 0–149)
VLDL Cholesterol Cal: 37 mg/dL (ref 5–40)

## 2016-05-31 LAB — TSH: TSH: 4.87 u[IU]/mL — AB (ref 0.450–4.500)

## 2016-06-01 LAB — PAP IG AND HPV HIGH-RISK
HPV, high-risk: NEGATIVE
PAP SMEAR COMMENT: 0

## 2016-06-02 ENCOUNTER — Other Ambulatory Visit: Payer: Self-pay | Admitting: Physician Assistant

## 2016-06-02 LAB — T4, FREE: Free T4: 1.19 ng/dL (ref 0.82–1.77)

## 2016-06-02 LAB — T3, FREE: T3, Free: 3.4 pg/mL (ref 2.0–4.4)

## 2016-06-02 LAB — SPECIMEN STATUS REPORT

## 2016-06-02 MED ORDER — LEVOTHYROXINE SODIUM 25 MCG PO TABS: 25.0000 ug | ORAL_TABLET | Freq: Every day | ORAL | 1 refills | Status: DC

## 2016-06-02 NOTE — Progress Notes (Signed)
Meds ordered this encounter  Medications  . levothyroxine (SYNTHROID, LEVOTHROID) 25 MCG tablet    Sig: Take 1 tablet (25 mcg total) by mouth daily before breakfast.    Dispense:  90 tablet    Refill:  1    Order Specific Question:   Supervising Provider    Answer:   Ethelda ChickSMITH, KRISTI M [[id number]]

## 2016-06-07 ENCOUNTER — Telehealth: Payer: Self-pay

## 2016-06-07 NOTE — Telephone Encounter (Signed)
PA was approved via cover my meds today.  Patient notified.

## 2016-08-18 ENCOUNTER — Telehealth: Payer: Self-pay | Admitting: Physician Assistant

## 2016-08-18 NOTE — Telephone Encounter (Signed)
PATIENT'S DAUGHTER ([name]) WOULD LIKE BRITTANY TO KNOW THAT SHE NEEDS A REFILL ON HER LEVOTHYROXINE 25 MCG TABLET.  BEST PHONE [phone] (DAUGHTER'S NAME IS [name] HUSSEIN AND SHE IS ON HER MOTHER'S 2018 HIPAA) PHARMACY CHOICE IS [facility] ON [address]. MBC

## 2016-08-18 NOTE — Telephone Encounter (Signed)
Spoke with patient's daughter and advised that a 41month prescription was given on 06/02/16. Stated that the pharmacy should already have the medication there or call them and have them refill it. Patient has enough refills until November

## 2016-08-20 ENCOUNTER — Other Ambulatory Visit: Payer: Self-pay | Admitting: Family Medicine

## 2016-09-02 ENCOUNTER — Ambulatory Visit (INDEPENDENT_AMBULATORY_CARE_PROVIDER_SITE_OTHER): Payer: BLUE CROSS/BLUE SHIELD | Admitting: Physician Assistant

## 2016-09-02 ENCOUNTER — Encounter: Payer: Self-pay | Admitting: Physician Assistant

## 2016-09-02 VITALS — BP 131/81 | HR 76 | Temp 98.4°F | Resp 17 | Ht 65.0 in | Wt 221.0 lb

## 2016-09-02 DIAGNOSIS — M545 Low back pain, unspecified: Secondary | ICD-10-CM

## 2016-09-02 DIAGNOSIS — M79604 Pain in right leg: Secondary | ICD-10-CM | POA: Diagnosis not present

## 2016-09-02 DIAGNOSIS — M6289 Other specified disorders of muscle: Secondary | ICD-10-CM | POA: Diagnosis not present

## 2016-09-02 MED ORDER — MELOXICAM 7.5 MG PO TABS: 7.5000 mg | ORAL_TABLET | Freq: Every day | ORAL | 0 refills | Status: DC

## 2016-09-02 MED ORDER — CYCLOBENZAPRINE HCL 5 MG PO TABS: 5.0000 mg | ORAL_TABLET | Freq: Three times a day (TID) | ORAL | 0 refills | Status: DC | PRN

## 2016-09-02 NOTE — Patient Instructions (Addendum)
I recommend using heating pad the low back 4-5 x a day for 20 minutes at a time. Also start doing the stretches below. For pain, you can use flexeril and meloxicam for the next 1-2 weeks. Follow up in 2 weeks if no improvement.   Just to know, flexeril can cause side effects that may impair your thinking or reactions. Be careful if you drive or do anything that requires you to be awake and alert. void drinking alcohol, which can increase some of the side effects of Flexeril. If flexeril makes you sleeping, do not take during the day and just take two tablets at night time before bed.   NSAIDs like meloxicam have common side effects of heartburn, stomach pain, indigestion, and headache. Could lead to renal insufficiency, stroke, or GI bleed if taken excess amounts outside of what is recommended on label long term.    Please perform exercises below. Stretches are to be performed for 2 sets, holding 10-15 seconds each. Recommended to perform this rehab twice daily within pain tolerance for 2 weeks.   FLEXION RANGE OF MOTION AND STRETCHING EXERCISES: STRETCH - Flexion, Single Knee to Chest   Lie on a firm bed or floor with both legs extended in front of you.  Keeping one leg in contact with the floor, bring your opposite knee to your chest. Hold your leg in place by either grabbing behind your thigh or at your knee.  Pull until you feel a gentle stretch in your lower back.   Slowly release your grasp and repeat the exercise with the opposite side.  STRETCH - Flexion, Double Knee to Chest   Lie on a firm bed or floor with both legs extended in front of you.  Keeping one leg in contact with the floor, bring your opposite knee to your chest.  Tense your stomach muscles to support your back and then lift your other knee to your chest. Hold your legs in place by either grabbing behind your thighs or at your knees.  Pull both knees toward your chest until you feel a gentle stretch in your lower  back.   Tense your stomach muscles and slowly return one leg at a time to the floor.  STRETCH - Low Trunk Rotation  Lie on a firm bed or floor. Keeping your legs in front of you, bend your knees so they are both pointed toward the ceiling and your feet are flat on the floor.  Extend your arms out to the side. This will stabilize your upper body by keeping your shoulders in contact with the floor.  Gently and slowly drop both knees together to one side until you feel a gentle stretch in your lower back.   Tense your stomach muscles to support your lower back as you bring your knees back to the starting position. Repeat the exercise to the other side.   EXTENSION RANGE OF MOTION AND FLEXIBILITY EXERCISES: STRETCH - Extension, Prone on Elbows   Lie on your stomach on the floor, a bed will be too soft. Place your palms about shoulder width apart and at the height of your head.  Place your elbows under your shoulders. If this is too painful, stack pillows under your chest.  Allow your body to relax so that your hips drop lower and make contact more completely with the floor.  Slowly return to lying flat on the floor.  RANGE OF MOTION - Extension, Prone Press Ups  Lie on your stomach on the floor,  a bed will be too soft. Place your palms about shoulder width apart and at the height of your head.  Keeping your back as relaxed as possible, slowly straighten your elbows while keeping your hips on the floor. You may adjust the placement of your hands to maximize your comfort. As you gain motion, your hands will come more underneath your shoulders.  Slowly return to lying flat on the floor.  RANGE OF MOTION- Quadruped, Neutral Spine   Assume a hands and knees position on a firm surface. Keep your hands under your shoulders and your knees under your hips. You may place padding under your knees for comfort.  Drop your head and point your tail bone toward the ground below you. This will round  out your lower back like an angry cat.    Slowly lift your head and release your tail bone so that your back sags into a large arch, like an old horse.  Repeat this until you feel limber in your lower back.  Now, find your "sweet spot." This will be the most comfortable position somewhere between the two previous positions. This is your neutral spine. Once you have found this position, tense your stomach muscles to support your lower back.  STRENGTHENING EXERCISES - Low Back Strain These exercises may help you when beginning to rehabilitate your injury. These exercises should be done near your "sweet spot." This is the neutral, low-back arch, somewhere between fully rounded and fully arched, that is your least painful position. When performed in this safe range of motion, these exercises can be used for people who have either a flexion or extension based injury. These exercises may resolve your symptoms with or without further involvement from your physician, physical therapist or athletic trainer. While completing these exercises, remember:   Muscles can gain both the endurance and the strength needed for everyday activities through controlled exercises.  Complete these exercises as instructed by your physician, physical therapist or athletic trainer. Increase the resistance and repetitions only as guided.  You may experience muscle soreness or fatigue, but the pain or discomfort you are trying to eliminate should never worsen during these exercises. If this pain does worsen, stop and make certain you are following the directions exactly. If the pain is still present after adjustments, discontinue the exercise until you can discuss the trouble with your caregiver.  STRENGTHENING - Deep Abdominals, Pelvic Tilt  Lie on a firm bed or floor. Keeping your legs in front of you, bend your knees so they are both pointed toward the ceiling and your feet are flat on the floor.  Tense your lower abdominal  muscles to press your lower back into the floor. This motion will rotate your pelvis so that your tail bone is scooping upwards rather than pointing at your feet or into the floor.  STRENGTHENING - Abdominals, Crunches   Lie on a firm bed or floor. Keeping your legs in front of you, bend your knees so they are both pointed toward the ceiling and your feet are flat on the floor. Cross your arms over your chest.  Slightly tip your chin down without bending your neck.  Tense your abdominals and slowly lift your trunk high enough to just clear your shoulder blades. Lifting higher can put excessive stress on the lower back and does not further strengthen your abdominal muscles.  Control your return to the starting position.  STRENGTHENING - Quadruped, Opposite UE/LE Lift   Assume a hands and knees position  on a firm surface. Keep your hands under your shoulders and your knees under your hips. You may place padding under your knees for comfort.  Find your neutral spine and gently tense your abdominal muscles so that you can maintain this position. Your shoulders and hips should form a rectangle that is parallel with the floor and is not twisted.  Keeping your trunk steady, lift your right hand no higher than your shoulder and then your left leg no higher than your hip. Make sure you are not holding your breath.   Continuing to keep your abdominal muscles tense and your back steady, slowly return to your starting position. Repeat with the opposite arm and leg.  STRENGTHENING - Lower Abdominals, Double Knee Lift  Lie on a firm bed or floor. Keeping your legs in front of you, bend your knees so they are both pointed toward the ceiling and your feet are flat on the floor.  Tense your abdominal muscles to brace your lower back and slowly lift both of your knees until they come over your hips. Be certain not to hold your breath.  POSTURE AND BODY MECHANICS CONSIDERATIONS - Low Back Strain Keeping  correct posture when sitting, standing or completing your activities will reduce the stress put on different body tissues, allowing injured tissues a chance to heal and limiting painful experiences. The following are general guidelines for improved posture. Your physician or physical therapist will provide you with any instructions specific to your needs. While reading these guidelines, remember:  The exercises prescribed by your provider will help you have the flexibility and strength to maintain correct postures.  The correct posture provides the best environment for your joints to work. All of your joints have less wear and tear when properly supported by a spine with good posture. This means you will experience a healthier, less painful body.  Correct posture must be practiced with all of your activities, especially prolonged sitting and standing. Correct posture is as important when doing repetitive low-stress activities (typing) as it is when doing a single heavy-load activity (lifting). RESTING POSITIONS Consider which positions are most painful for you when choosing a resting position. If you have pain with flexion-based activities (sitting, bending, stooping, squatting), choose a position that allows you to rest in a less flexed posture. You would want to avoid curling into a fetal position on your side. If your pain worsens with extension-based activities (prolonged standing, working overhead), avoid resting in an extended position such as sleeping on your stomach. Most people will find more comfort when they rest with their spine in a more neutral position, neither too rounded nor too arched. Lying on a non-sagging bed on your side with a pillow between your knees, or on your back with a pillow under your knees will often provide some relief. Keep in mind, being in any one position for a prolonged period of time, no matter how correct your posture, can still lead to stiffness. PROPER SITTING  POSTURE In order to minimize stress and discomfort on your spine, you must sit with correct posture. Sitting with good posture should be effortless for a healthy body. Returning to good posture is a gradual process. Many people can work toward this most comfortably by using various supports until they have the flexibility and strength to maintain this posture on their own. When sitting with proper posture, your ears will fall over your shoulders and your shoulders will fall over your hips. You should use the back of  the chair to support your upper back. Your lower back will be in a neutral position, just slightly arched. You may place a small pillow or folded towel at the base of your lower back for support.  When working at a desk, create an environment that supports good, upright posture. Without extra support, muscles tire, which leads to excessive strain on joints and other tissues. Keep these recommendations in mind: CHAIR:  A chair should be able to slide under your desk when your back makes contact with the back of the chair. This allows you to work closely.  The chair's height should allow your eyes to be level with the upper part of your monitor and your hands to be slightly lower than your elbows. BODY POSITION  Your feet should make contact with the floor. If this is not possible, use a foot rest.  Keep your ears over your shoulders. This will reduce stress on your neck and lower back. INCORRECT SITTING POSTURES  If you are feeling tired and unable to assume a healthy sitting posture, do not slouch or slump. This puts excessive strain on your back tissues, causing more damage and pain. Healthier options include:  Using more support, like a lumbar pillow.  Switching tasks to something that requires you to be upright or walking.  Talking a brief walk.  Lying down to rest in a neutral-spine position. PROLONGED STANDING WHILE SLIGHTLY LEANING FORWARD  When completing a task that  requires you to lean forward while standing in one place for a long time, place either foot up on a stationary 2-4 inch high object to help maintain the best posture. When both feet are on the ground, the lower back tends to lose its slight inward curve. If this curve flattens (or becomes too large), then the back and your other joints will experience too much stress, tire more quickly, and can cause pain. CORRECT STANDING POSTURES Proper standing posture should be assumed with all daily activities, even if they only take a few moments, like when brushing your teeth. As in sitting, your ears should fall over your shoulders and your shoulders should fall over your hips. You should keep a slight tension in your abdominal muscles to brace your spine. Your tailbone should point down to the ground, not behind your body, resulting in an over-extended swayback posture.  INCORRECT STANDING POSTURES  Common incorrect standing postures include a forward head, locked knees and/or an excessive swayback. WALKING Walk with an upright posture. Your ears, shoulders and hips should all line-up. PROLONGED ACTIVITY IN A FLEXED POSITION When completing a task that requires you to bend forward at your waist or lean over a low surface, try to find a way to stabilize 3 out of 4 of your limbs. You can place a hand or elbow on your thigh or rest a knee on the surface you are reaching across. This will provide you more stability so that your muscles do not fatigue as quickly. By keeping your knees relaxed, or slightly bent, you will also reduce stress across your lower back. CORRECT LIFTING TECHNIQUES DO :   Assume a wide stance. This will provide you more stability and the opportunity to get as close as possible to the object which you are lifting.  Tense your abdominals to brace your spine. Bend at the knees and hips. Keeping your back locked in a neutral-spine position, lift using your leg muscles. Lift with your legs,  keeping your back straight.  Test the weight  of unknown objects before attempting to lift them.  Try to keep your elbows locked down at your sides in order get the best strength from your shoulders when carrying an object.  Always ask for help when lifting heavy or awkward objects. INCORRECT LIFTING TECHNIQUES DO NOT:   Lock your knees when lifting, even if it is a small object.  Bend and twist. Pivot at your feet or move your feet when needing to change directions.  Assume that you can safely pick up even a paper clip without proper posture.      Spondylolisthesis Rehab Ask your health care provider which exercises are safe for you. Do exercises exactly as told by your health care provider and adjust them as directed. It is normal to feel mild stretching, pulling, tightness, or discomfort as you do these exercises, but you should stop right away if you feel sudden pain or your pain gets worse. Do not begin these exercises until told by your health care provider. Stretching and range of motion exercises These exercises warm up your muscles and joints and improve the movement and flexibility of your hips and your back. These exercises may also help to relieve pain, numbness, and tingling. Exercise A: Single knee to chest  1. Lie on your back on a firm surface with both legs straight. 2. Bend one of your knees. Use your hands to move your knee up toward your chest until you feel a gentle stretch in your lower back and buttock. ? Hold your leg in this position by holding onto the front of your knee. ? Keep your other leg as straight as possible. 3. Hold for __________ seconds. 4. Slowly return to the starting position. 5. Repeat this exercise with your other leg. Repeat __________ times. Complete this exercise __________ times a day. Exercise B: Double knee to chest  1. Lie on your back on a firm surface with both legs straight. 2. Bend one of your knees and move it toward your chest  until you feel a gentle stretch in your lower back and buttock. 3. Tense your abdominal muscles and repeat the previous step with your other leg. 4. Hold both of your legs in this position by holding onto the backs of your thighs or the fronts of your knees. 5. Hold for __________ seconds. 6. Tense your abdominal muscles and slowly move your legs back to the floor, one leg at a time. Repeat __________ times. Complete this exercise __________ times a day. Strengthening exercises These exercises build strength and endurance in your back. Endurance is the ability to use your muscles for a long time, even after they get tired. Exercise C: Pelvic tilt 1. Lie on your back on a firm bed or the floor. Bend your knees and keep your feet flat. 2. Tense your abdominal muscles. Tip your pelvis up toward the ceiling and flatten your lower back into the floor. ? To help with this exercise, you may place a small towel under your lower back and try to push your back into the towel. 3. Hold for __________ seconds. 4. Let your muscles relax completely before you repeat this exercise. Repeat __________ times. Complete this exercise __________ times a day. Exercise D: Abdominal crunch  1. Lie on your back on a firm surface. Bend your knees and keep your feet flat. Cross your arms over your chest. 2. Tuck your chin down toward your chest, without bending your neck. 3. Use your abdominal muscles to lift your upper body  off of the ground, straight up into the air. ? Try to lift yourself until your shoulder blades are off the ground. You may need to work up to this. ? Keep your lower back on the ground while you crunch upward. ? Do not hold your breath. 4. Slowly lower yourself down. Keep your abdominal muscles tense until you are back to the starting position. Repeat __________ times. Complete this exercise __________ times a day. Exercise E: Alternating arm and leg raises  1. Get on your hands and knees on a  firm surface. If you are on a hard floor, you may want to use padding to cushion your knees, such as an exercise mat. 2. Line up your arms and legs. Your hands should be below your shoulders, and your knees should be below your hips. 3. Lift your left leg behind you. At the same time, raise your right arm and straighten it in front of you. ? Do not lift your leg higher than your hip. ? Do not lift your arm higher than your shoulder. ? Keep your abdominal and back muscles tight. ? Keep your hips facing the ground. ? Do not arch your back. ? Keep your balance carefully, and do not hold your breath. 4. Hold for __________ seconds. 5. Slowly return to the starting position and repeat with your right leg and your left arm. Repeat __________ times. Complete this exercise __________ times a day. Posture and body mechanics  Body mechanics refers to the movements and positions of your body while you do your daily activities. Posture is part of body mechanics. Good posture and healthy body mechanics can help to relieve stress in your body's tissues and joints. Good posture means that your spine is in its natural S-curve position (your spine is neutral), your shoulders are pulled back slightly, and your head is not tipped forward. The following are general guidelines for applying improved posture and body mechanics to your everyday activities. Standing   When standing, keep your spine neutral and your feet about hip-width apart. Keep a slight bend in your knees. Your ears, shoulders, and hips should line up.  When you do a task in which you stand in one place for a long time, place one foot up on a stable object that is 2-4 inches (5-10 cm) high, such as a footstool. This helps keep your spine neutral. Sitting   When sitting, keep your spine neutral and keep your feet flat on the floor. Use a footrest, if necessary, and keep your thighs parallel to the floor. Avoid rounding your shoulders, and avoid  tilting your head forward.  When working at a desk or a computer, keep your desk at a height where your hands are slightly lower than your elbows. Slide your chair under your desk so you are close enough to maintain good posture.  When working at a computer, place your monitor at a height where you are looking straight ahead and you do not have to tilt your head forward or downward to look at the screen. Resting  When lying down and resting, avoid positions that are most painful for you.  If you have pain with activities such as sitting, bending, stooping, or squatting (flexion-based activities), lie in a position in which your body does not bend very much. For example, avoid curling up on your side with your arms and knees near your chest (fetal position).  If you have pain with activities such as standing for a long time or  reaching with your arms (extension-based activities), lie with your spine in a neutral position and bend your knees slightly. Try the following positions: ? Lying on your side with a pillow between your knees. ? Lying on your back with a pillow under your knees.  Lifting   When lifting objects, keep your feet at least shoulder-width apart and tighten your abdominal muscles.  Bend your knees and hips and keep your spine neutral. It is important to lift using the strength of your legs, not your back. Do not lock your knees straight out.  Always ask for help to lift heavy or awkward objects. This information is not intended to replace advice given to you by your health care provider. Make sure you discuss any questions you have with your health care provider. Document Released: 01/16/2005 Document Revised: 09/23/2015 Document Reviewed: 10/27/2014 [employer] Interactive Patient Education  2018 [employer].    IF you received an x-ray today, you will receive an invoice from [facility]. Please contact [facility] at [phone] with questions or  concerns regarding your invoice.   IF you received labwork today, you will receive an invoice from [place]. Please contact [facility] at [phone] with questions or concerns regarding your invoice.   Our billing staff will not be able to assist you with questions regarding bills from these companies.  You will be contacted with the lab results as soon as they are available. The fastest way to get your results is to activate your My Chart account. Instructions are located on the last page of this paperwork. If you have not heard from [place] regarding the results in 2 weeks, please contact this office.

## 2016-09-02 NOTE — Progress Notes (Signed)
[name]  MRN: [id number] DOB: 01/19/1971  Subjective:  [name] is a 46 y.o. female seen in office today for a chief complaint of right lower leg pain x 2 months. Denies acute injury, numbness, tingling, redness, swelling, and warmth It occurs all day. It hurts when she is sititng, walking, or doing any other movement. Described as an ache and burning. 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 Has associated low back pain. In terms of back pain, no known acute injury, bladder/bowel incontinnece, saddle anesthesia, and hematuria. Notes the pain is worsened after she uses vitamin d. Has tried tylenol with no relief. She denies smoking, exogenous hormone use, recent immobilizations, hx of cancer or blood clots. No hx of diabetes or peripheral neuropathies.   Review of Systems  Constitutional: Negative for chills, diaphoresis and fever.  Respiratory: Negative for cough and shortness of breath.   Cardiovascular: Negative for chest pain and palpitations.  Gastrointestinal: Negative for abdominal pain, nausea and vomiting.  Neurological: Negative for dizziness and light-headedness.    Patient Active Problem List   Diagnosis Date Noted  . Hypothyroidism 05/23/2016  . Esophageal reflux 08/05/2015  . 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 Goiter 08/05/2015  . Hyperthyroidism 08/05/2015    Current Outpatient Prescriptions on File Prior to Visit  Medication Sig Dispense Refill  . acetaminophen (TYLENOL) 325 MG tablet Take 650 mg by mouth every 6 (six) hours as needed.    Marland Kitchen. CALCIUM PO Take 1 tablet by mouth daily.    . cetirizine (ZYRTEC) 10 MG tablet Take 1 tablet (10 mg total) by mouth daily. 90 tablet 1  . Cholecalciferol (VITAMIN D PO) Take 1 capsule by mouth daily.    . fluticasone (FLONASE) 50 MCG/ACT nasal spray Place 2 sprays into both nostrils daily. 16 g 6  . levothyroxine (SYNTHROID, LEVOTHROID) 25 MCG tablet Take 1 tablet (25 mcg total) by mouth  daily before breakfast. 90 tablet 1  . pantoprazole (PROTONIX) 40 MG tablet Take 1 tablet (40 mg total) by mouth daily. 30 tablet 3  . Simethicone 40 MG/0.6ML LIQD Take 1.2 mLs (80 mg total) by mouth every 6 (six) hours as needed. 30 mL 6   No current facility-administered medications on file prior to visit.     No Known Allergies   Objective:  BP 131/81   Pulse 76   Temp 98.4 F (36.9 C) (Oral)   Resp 17   Ht 5\' 5"  (1.651 m)   Wt 221 lb (100.2 kg)   SpO2 100%   BMI 36.78 kg/m   Physical Exam  Constitutional: She is oriented to person, place, and time and well-developed, well-nourished, and in no distress.  HENT:  Head: Normocephalic and atraumatic.  Eyes: Conjunctivae are normal.  Neck: Normal range of motion.  Pulmonary/Chest: Effort normal.  Musculoskeletal:       Right knee: Normal. She exhibits normal range of motion. No tenderness found.       Right ankle: Normal. She exhibits normal range of motion, no swelling and no ecchymosis. No tenderness.       Left ankle: Normal.       Thoracic back: Normal.       Lumbar back: She exhibits tenderness (mild tenderness with palpation of right sided musculature tightness).       Right lower leg: Normal. She exhibits no tenderness, no bony tenderness and no swelling.       Left lower leg: She  exhibits no tenderness, no bony tenderness and no swelling.       Right foot: Normal.       Left foot: Normal.  Calf measures 38 cm bilaterally. No swelling, erythema, or warmth noted on lower extremities bilaterally.   Neurological: She is alert and oriented to person, place, and time. She has normal sensation and normal strength. She has a normal Straight Leg Raise Test. Gait normal.  Reflex Scores:      Tricep reflexes are 2+ on the right side and 2+ on the left side.      Bicep reflexes are 2+ on the right side and 2+ on the left side.      Brachioradialis reflexes are 2+ on the right side and 2+ on the left side.      Patellar reflexes  are 2+ on the right side and 2+ on the left side.      Achilles reflexes are 2+ on the right side and 2+ on the left side. Skin: Skin is warm and dry.  Psychiatric: Affect normal.  Vitals reviewed.   Assessment and Plan :   1. Acute right-sided low back pain without sciatica 2. Right leg pain 3. Muscle tightness Physical exam findings are reassuring. Pt is likely experiencing overuse injury as she is constantly on her feet at work and does not stretch. Will treat with rest, heating pad, NSAID, muscle relaxant, and daily stretching. Pt encouraged to return to clinic if symptoms worsen, do not improve in 2 weeks, or as needed - cyclobenzaprine (FLEXERIL) 5 MG tablet; Take 1 tablet (5 mg total) by mouth 3 (three) times daily as needed for muscle spasms.  Dispense: 60 tablet; Refill: 0 - meloxicam (MOBIC) 7.5 MG tablet; Take 1 tablet (7.5 mg total) by mouth daily.  Dispense: 30 tablet; Refill: 0  Benjiman CoreBrittany Bayley Hurn, PA-C  [facility] 09/04/2016 10:19 AM

## 2016-10-20 ENCOUNTER — Ambulatory Visit (INDEPENDENT_AMBULATORY_CARE_PROVIDER_SITE_OTHER): Payer: BLUE CROSS/BLUE SHIELD

## 2016-10-20 ENCOUNTER — Ambulatory Visit (INDEPENDENT_AMBULATORY_CARE_PROVIDER_SITE_OTHER): Payer: BLUE CROSS/BLUE SHIELD | Admitting: Physician Assistant

## 2016-10-20 ENCOUNTER — Encounter: Payer: Self-pay | Admitting: Physician Assistant

## 2016-10-20 VITALS — BP 125/81 | HR 80 | Temp 97.9°F | Resp 18 | Ht 65.51 in | Wt 220.4 lb

## 2016-10-20 DIAGNOSIS — G8929 Other chronic pain: Secondary | ICD-10-CM

## 2016-10-20 DIAGNOSIS — M1711 Unilateral primary osteoarthritis, right knee: Secondary | ICD-10-CM

## 2016-10-20 DIAGNOSIS — M25561 Pain in right knee: Secondary | ICD-10-CM

## 2016-10-20 DIAGNOSIS — M545 Low back pain, unspecified: Secondary | ICD-10-CM

## 2016-10-20 DIAGNOSIS — E039 Hypothyroidism, unspecified: Secondary | ICD-10-CM

## 2016-10-20 DIAGNOSIS — Z8739 Personal history of other diseases of the musculoskeletal system and connective tissue: Secondary | ICD-10-CM

## 2016-10-20 MED ORDER — PREDNISONE 20 MG PO TABS: ORAL_TABLET | ORAL | 0 refills | Status: DC

## 2016-10-20 NOTE — Patient Instructions (Addendum)
We are going to treat your underlying inflammation with oral prednisone. Prednisone is a steroid and can cause side effects such as headache, irritability, nausea, vomiting, increased heart rate, increased blood pressure, increased blood sugar, appetite changes, and insomnia. Please take tablets in the morning with a full meal to help decrease the chances of these side effects.   I have also put in a referral for physical therapy and they will contact you within the next week.  Please return if no improvement in 2 weeks.   Osteoarthritis Osteoarthritis is a type of arthritis that affects tissue that covers the ends of bones in joints (cartilage). Cartilage acts as a cushion between the bones and helps them move smoothly. Osteoarthritis results when cartilage in the joints gets worn down. Osteoarthritis is sometimes called "wear and tear" arthritis. Osteoarthritis is the most common form of arthritis. It often occurs in older people. It is a condition that gets worse over time (a progressive condition). Joints that are most often affected by this condition are in:  Fingers.  Toes.  Hips.  Knees.  Spine, including neck and lower back.  What are the causes? This condition is caused by age-related wearing down of cartilage that covers the ends of bones. What increases the risk? The following factors may make you more likely to develop this condition:  Older age.  Being overweight or obese.  Overuse of joints, such as in athletes.  Past injury of a joint.  Past surgery on a joint.  Family history of osteoarthritis.  What are the signs or symptoms? The main symptoms of this condition are pain, swelling, and stiffness in the joint. The joint may lose its shape over time. Small pieces of bone or cartilage may break off and float inside of the joint, which may cause more pain and damage to the joint. Small deposits of bone (osteophytes) may grow on the edges of the joint. Other symptoms  may include:  A grating or scraping feeling inside the joint when you move it.  Popping or creaking sounds when you move.  Symptoms may affect one or more joints. Osteoarthritis in a major joint, such as your knee or hip, can make it painful to walk or exercise. If you have osteoarthritis in your hands, you might not be able to grip items, twist your hand, or control small movements of your hands and fingers (fine motor skills). How is this diagnosed? This condition may be diagnosed based on:  Your medical history.  A physical exam.  Your symptoms.  X-rays of the affected joint(s).  Blood tests to rule out other types of arthritis.  How is this treated? There is no cure for this condition, but treatment can help to control pain and improve joint function. Treatment plans may include:  A prescribed exercise program that allows for rest and joint relief. You may work with a physical therapist.  A weight control plan.  Pain relief techniques, such as: ? Applying heat and cold to the joint. ? Electric pulses delivered to nerve endings under the skin (transcutaneous electrical nerve stimulation, or TENS). ? Massage. ? Certain nutritional supplements.  NSAIDs or prescription medicines to help relieve pain.  Medicine to help relieve pain and inflammation (corticosteroids). This can be given by mouth (orally) or as an injection.  Assistive devices, such as a brace, wrap, splint, specialized glove, or cane.  Surgery, such as: ? An osteotomy. This is done to reposition the bones and relieve pain or to remove loose  pieces of bone and cartilage. ? Joint replacement surgery. You may need this surgery if you have very bad (advanced) osteoarthritis.  Follow these instructions at home: Activity  Rest your affected joints as directed by your health care provider.  Do not drive or use heavy machinery while taking prescription pain medicine.  Exercise as directed. Your health care  provider or physical therapist may recommend specific types of exercise, such as: ? Strengthening exercises. These are done to strengthen the muscles that support joints that are affected by arthritis. They can be performed with weights or with exercise bands to add resistance. ? Aerobic activities. These are exercises, such as brisk walking or water aerobics, that get your heart pumping. ? Range-of-motion activities. These keep your joints easy to move. ? Balance and agility exercises. Managing pain, stiffness, and swelling  If directed, apply heat to the affected area as often as told by your health care provider. Use the heat source that your health care provider recommends, such as a moist heat pack or a heating pad. ? If you have a removable assistive device, remove it as told by your health care provider. ? Place a towel between your skin and the heat source. If your health care provider tells you to keep the assistive device on while you apply heat, place a towel between the assistive device and the heat source. ? Leave the heat on for 20-30 minutes. ? Remove the heat if your skin turns bright red. This is especially important if you are unable to feel pain, heat, or cold. You may have a greater risk of getting burned.  If directed, put ice on the affected joint: ? If you have a removable assistive device, remove it as told by your health care provider. ? Put ice in a plastic bag. ? Place a towel between your skin and the bag. If your health care provider tells you to keep the assistive device on during icing, place a towel between the assistive device and the bag. ? Leave the ice on for 20 minutes, 2-3 times a day. General instructions  Take over-the-counter and prescription medicines only as told by your health care provider.  Maintain a healthy weight. Follow instructions from your health care provider for weight control. These may include dietary restrictions.  Do not use any  products that contain nicotine or tobacco, such as cigarettes and e-cigarettes. These can delay bone healing. If you need help quitting, ask your health care provider.  Use assistive devices as directed by your health care provider.  Keep all follow-up visits as told by your health care provider. This is important. Where to find more information:  [employer] of Arthritis and Musculoskeletal and Skin Diseases: www.niams.http://www.myers.net/  [employer] on Aging: https://walker.com/  American College of Rheumatology: www.rheumatology.org Contact a health care provider if:  Your skin turns red.  You develop a rash.  You have pain that gets worse.  You have a fever along with joint or muscle aches. Get help right away if:  You lose a lot of weight.  You suddenly lose your appetite.  You have night sweats. Summary  Osteoarthritis is a type of arthritis that affects tissue covering the ends of bones in joints (cartilage).  This condition is caused by age-related wearing down of cartilage that covers the ends of bones.  The main symptom of this condition is pain, swelling, and stiffness in the joint.  There is no cure for this condition, but treatment can help to  control pain and improve joint function. This information is not intended to replace advice given to you by your health care provider. Make sure you discuss any questions you have with your health care provider. Document Released: 01/16/2005 Document Revised: 09/20/2015 Document Reviewed: 09/20/2015 Elsevier Interactive Patient Education  2018 [employer].    IF you received an x-ray today, you will receive an invoice from [facility]. Please contact [facility] at [phone] with questions or concerns regarding your invoice.   IF you received labwork today, you will receive an invoice from [facility]. Please contact [facility] at [phone] with questions or concerns regarding your invoice.   Our  billing staff will not be able to assist you with questions regarding bills from these companies.  You will be contacted with the lab results as soon as they are available. The fastest way to get your results is to activate your My Chart account. Instructions are located on the last page of this paperwork. If you have not heard from [place] regarding the results in 2 weeks, please contact this office.

## 2016-10-20 NOTE — Progress Notes (Signed)
[name]  MRN: [id number] DOB: Apr 02, 1970  Subjective:  [name] is a 46 y.o. female seen in office today for a chief complaint of follow up for leg pain.   Has had pain x 3 months. Denies acute injury, numbness, tingling, redness, swelling, and warmth. It occurs all day. It hurts when she is sititng, walking, or doing any other movement. Described as an ache and burning. 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 Has associated low back pain and right knee pain. In terms of right knee and low back pain, no known acute injury, bladder/bowel incontinnece, saddle anesthesia, and hematuria. Has tried tylenol, meloxicam and flexeril and it did not help. She denies smoking, exogenous hormone use, recent immobilizations, hx of cancer or blood clots. No hx of diabetes or peripheral neuropathies. Last plain film of lumbar spine was in 2015 and showed early degenerative disc changes. She is also requesiting recheck thyroid. Notes her thyroid "feels weird." Denies chest pain, heart palpitations, diaphoresis, weight gain, diarrhea, constipation, heat or cold intolerance.  Of note, patient is a poor historian. She is accompanied by her daughter today, who is translating.  Review of Systems  Constitutional: Negative for chills, diaphoresis and fever.  Gastrointestinal: Negative for nausea and vomiting.    Patient Active Problem List   Diagnosis Date Noted  . Hypothyroidism 05/23/2016  . Esophageal reflux 08/05/2015  . 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 Goiter 08/05/2015  . Hyperthyroidism 08/05/2015    Current Outpatient Prescriptions on File Prior to Visit  Medication Sig Dispense Refill  . acetaminophen (TYLENOL) 325 MG tablet Take 650 mg by mouth every 6 (six) hours as needed.    Marland Kitchen CALCIUM PO Take 1 tablet by mouth daily.    . cetirizine (ZYRTEC) 10 MG tablet Take 1 tablet (10 mg total) by mouth daily. 90 tablet 1  . Cholecalciferol (VITAMIN D PO) Take 1  capsule by mouth daily.    . cyclobenzaprine (FLEXERIL) 5 MG tablet Take 1 tablet (5 mg total) by mouth 3 (three) times daily as needed for muscle spasms. 60 tablet 0  . fluticasone (FLONASE) 50 MCG/ACT nasal spray Place 2 sprays into both nostrils daily. 16 g 6  . levothyroxine (SYNTHROID, LEVOTHROID) 25 MCG tablet Take 1 tablet (25 mcg total) by mouth daily before breakfast. 90 tablet 1  . meloxicam (MOBIC) 7.5 MG tablet Take 1 tablet (7.5 mg total) by mouth daily. 30 tablet 0  . pantoprazole (PROTONIX) 40 MG tablet Take 1 tablet (40 mg total) by mouth daily. 30 tablet 3  . Simethicone 40 MG/0.6ML LIQD Take 1.2 mLs (80 mg total) by mouth every 6 (six) hours as needed. 30 mL 6   No current facility-administered medications on file prior to visit.     No Known Allergies    Social History   Social History  . Marital status: Married    Spouse name: Malayiah Mcbrayer  . Number of children: 2  . Years of education: 9th grade   Occupational History  . Housekeeping    Social History Main Topics  . Smoking status: Former Smoker    Quit date: 05/30/2013  . Smokeless tobacco: Never Used     Comment: smoked 2-3 cigarettes a day for 5 years  . Alcohol use No  . Drug use: No  . Sexual activity: Yes    Partners: Male   Other Topics Concern  . Not on file   Social History  Narrative   From [place]. Came to the [place] 2000. Lives with her husband and their two daughters.    Objective:  BP 125/81 (BP Location: Right Arm, Patient Position: Sitting, Cuff Size: Large)   Pulse 80   Temp 97.9 F (36.6 C) (Oral)   Resp 18   Ht 5' 5.51" (1.664 m)   Wt 220 lb 6.4 oz (100 kg)   SpO2 97%   BMI 36.11 kg/m   Physical Exam  Constitutional: She is oriented to person, place, and time and well-developed, well-nourished, and in no distress.  HENT:  Head: Normocephalic and atraumatic.  Eyes: Conjunctivae are normal.  Neck: Normal range of motion. No thyroid mass and no thyromegaly present.    Pulmonary/Chest: Effort normal.  Musculoskeletal:       Right knee: She exhibits normal range of motion, no swelling, no ecchymosis and no erythema. Tenderness (mild) found. Medial joint line and lateral joint line tenderness noted. No MCL, no LCL and no patellar tendon tenderness noted.       Cervical back: Normal.       Thoracic back: Normal.       Lumbar back: She exhibits tenderness (with palpation of right sided musculature). She exhibits normal range of motion, no bony tenderness and no swelling.       Right upper leg: She exhibits no tenderness, no bony tenderness and no swelling.       Right lower leg: She exhibits no tenderness, no bony tenderness and no swelling.  Calf measures 38 cm bilaterally. No swelling, erythema, or warmth noted on lower extremities bilaterally.  5/5 strength of lower extremities bilaterally.  Neurological: She is alert and oriented to person, place, and time. She has a normal Straight Leg Raise Test. Gait normal. Gait normal.  Reflex Scores:      Tricep reflexes are 2+ on the right side and 2+ on the left side.      Bicep reflexes are 2+ on the right side and 2+ on the left side.      Brachioradialis reflexes are 2+ on the right side and 2+ on the left side.      Patellar reflexes are 2+ on the right side and 2+ on the left side.      Achilles reflexes are 2+ on the right side and 2+ on the left side. Skin: Skin is warm and dry.  Psychiatric: Affect normal.  Vitals reviewed.   Dg Lumbar Spine Complete  Result Date: 10/20/2016 CLINICAL DATA:  Low back pain for the past 4 months with burning sensations down the right leg and in the right knee. EXAM: LUMBAR SPINE - COMPLETE 4+ VIEW COMPARISON:  04/09/2013. FINDINGS: Six non-rib-bearing vertebrae in the lumbar region. Mild to moderate anterior spur formation at multiple levels of the lumbar and lower thoracic spine. Facet degenerative changes in the lower lumbar spine. No fractures, pars defects or  subluxations. IMPRESSION: Mild to moderate multilevel degenerative changes, without significant change. No acute abnormality. Electronically Signed   By: Beckie Salts M.D.   On: 10/20/2016 18:18   Dg Knee Complete 4 Views Right  Result Date: 10/20/2016 CLINICAL DATA:  Burning sensations in the right knee and leg. EXAM: RIGHT KNEE - COMPLETE 4+ VIEW COMPARISON:  None. FINDINGS: Mild spur formation involving all 3 joint compartments. No effusion. IMPRESSION: Mild tricompartmental degenerative changes.  No acute abnormality. Electronically Signed   By: Beckie Salts M.D.   On: 10/20/2016 18:18     Assessment and Plan :  1. Chronic midline low back pain, with sciatica presence unspecified 2. History of burning pain in leg Plain films show a mild to moderate multilevel degenerative changes. History of burning in right leg could be consistent with sciatica. Patient is a poor historian. No acute findings on neuro exam. Will treat with prednisone course and referral to physical therapy. - DG Lumbar Spine Complete; Future - Ambulatory referral to Physical Therapy - predniSONE (DELTASONE) 20 MG tablet; Take 3 PO QAM x3days, 2 PO QAM x3days, 1 PO QAM x3days  Dispense: 18 tablet; Refill: 0 - [id number]+EGFR - Vitamin B12  3. Chronic pain of right knee No acute physical exam findings. Plain films suggest try compartmental degenerative changes. Educated patient on osteoarthritis. Given referral to physical therapy. Recommended Tylenol as needed for pain and nonweightbearing exercises. Patient encouraged to return if symptoms persist and will consider referral to orthopedic surgery. - DG Knee Complete 4 Views Right; Future - Ambulatory referral to Physical Therapy  4. Osteoarthritis of right knee, unspecified osteoarthritis type - Ambulatory referral to Physical Therapy  5. Hypothyroidism, unspecified type Recheck thyroid levels at this time. Depending on levels, may alter Synthroid dose. - TSH - T3, Free -  T4, Free  Tenna Delaine PA-C  Primary Care at [facility] 10/20/2016 6:23 PM

## 2016-10-21 ENCOUNTER — Emergency Department (HOSPITAL_COMMUNITY)
Admission: EM | Admit: 2016-10-21 | Discharge: 2016-10-21 | Disposition: A | Discharge status: Home / Self Care | Payer: BLUE CROSS/BLUE SHIELD | Attending: Emergency Medicine | Admitting: Emergency Medicine

## 2016-10-21 ENCOUNTER — Emergency Department (HOSPITAL_COMMUNITY): Payer: BLUE CROSS/BLUE SHIELD

## 2016-10-21 ENCOUNTER — Encounter (HOSPITAL_COMMUNITY): Payer: Self-pay

## 2016-10-21 DIAGNOSIS — E039 Hypothyroidism, unspecified: Secondary | ICD-10-CM | POA: Diagnosis not present

## 2016-10-21 DIAGNOSIS — Z79899 Other long term (current) drug therapy: Secondary | ICD-10-CM | POA: Insufficient documentation

## 2016-10-21 DIAGNOSIS — M25561 Pain in right knee: Secondary | ICD-10-CM

## 2016-10-21 DIAGNOSIS — R2689 Other abnormalities of gait and mobility: Secondary | ICD-10-CM | POA: Diagnosis not present

## 2016-10-21 DIAGNOSIS — Z87891 Personal history of nicotine dependence: Secondary | ICD-10-CM | POA: Diagnosis not present

## 2016-10-21 LAB — CMP14+EGFR
A/G RATIO: 1.4 (ref 1.2–2.2)
ALT: 15 IU/L (ref 0–32)
AST: 23 IU/L (ref 0–40)
Albumin: 4.5 g/dL (ref 3.5–5.5)
Alkaline Phosphatase: 71 IU/L (ref 39–117)
BILIRUBIN TOTAL: 0.3 mg/dL (ref 0.0–1.2)
BUN/Creatinine Ratio: 16 (ref 9–23)
BUN: 13 mg/dL (ref 6–24)
CALCIUM: 9.6 mg/dL (ref 8.7–10.2)
CHLORIDE: 105 mmol/L (ref 96–106)
CO2: 24 mmol/L (ref 20–29)
Creatinine, Ser: 0.83 mg/dL (ref 0.57–1.00)
GFR, EST AFRICAN AMERICAN: 98 mL/min/{1.73_m2} (ref >59)
GFR, EST NON AFRICAN AMERICAN: 85 mL/min/{1.73_m2} (ref >59)
GLOBULIN, TOTAL: 3.2 g/dL (ref 1.5–4.5)
Glucose: 87 mg/dL (ref 65–99)
POTASSIUM: 4.4 mmol/L (ref 3.5–5.2)
Sodium: 143 mmol/L (ref 134–144)
TOTAL PROTEIN: 7.7 g/dL (ref 6.0–8.5)

## 2016-10-21 LAB — TSH: TSH: 14.95 u[IU]/mL — ABNORMAL HIGH (ref 0.450–4.500)

## 2016-10-21 LAB — T3, FREE: T3 FREE: 2.7 pg/mL (ref 2.0–4.4)

## 2016-10-21 LAB — VITAMIN B12: Vitamin B-12: 337 pg/mL (ref 232–1245)

## 2016-10-21 LAB — T4, FREE: FREE T4: 1.06 ng/dL (ref 0.82–1.77)

## 2016-10-21 MED ORDER — METHOCARBAMOL 500 MG PO TABS: 500.0000 mg | ORAL_TABLET | Freq: Two times a day (BID) | ORAL | 0 refills | Status: DC

## 2016-10-21 MED ORDER — DICLOFENAC SODIUM 1 % TD GEL: 2.0000 g | Freq: Four times a day (QID) | TRANSDERMAL | 0 refills | Status: DC

## 2016-10-21 MED ORDER — OXYCODONE-ACETAMINOPHEN 5-325 MG PO TABS
ORAL_TABLET | ORAL | Status: AC
  Filled 2016-10-21: qty 1

## 2016-10-21 MED ORDER — OXYCODONE-ACETAMINOPHEN 5-325 MG PO TABS
1.0000 | ORAL_TABLET | ORAL | Status: DC | PRN
  Administered 2016-10-21: 1 via ORAL

## 2016-10-21 NOTE — ED Triage Notes (Signed)
Pt states she was diagnosed with arthritis of the knee yesterday. She was at work last night and does a lot of work on her feet, twisted her knee and now has severe right knee pain. Some swelling noted.

## 2016-10-21 NOTE — Discharge Instructions (Signed)
Robaxin as a muscle relaxer and may be taken up to 2 times per day. Diclofenac gel may be applied to areas that are painful up to 4 times per day. You may continue to take 800 mg of ibuprofen with food every 8 hours for pain and inflammation control.   Please apply ice 15-20 minutes up to 3-4 times a day. When you are sitting and resting, please elevate the knee on a pillow. You can wear the knee sleeve as needed for comfort.   If your symptoms do not start to improve in the next week, you can follow up with [facility] orthopedic and sports medicine.

## 2016-10-21 NOTE — ED Provider Notes (Signed)
[facility]-EMERGENCY DEPT Provider Note   CSN: [id number] Arrival date & time: 10/21/16  1456     History   Chief Complaint Chief Complaint  Patient presents with  . Knee Pain    HPI [name] is a 46 y.o. female with a h/o or osteoarthritis in the right knee who presents to the emergency department with a chief complaint of sudden onset, constant, atraumatic, non-radiating right knee pain. She denies bilateral hip, ankle, or right knee pain, fever, chills, numbness, or weakness. She states she treated her symptoms at home with ibuprofen, with moderate relief. No history of surgery or infection to the right knee.  She reports that she was seen by her PCP yesterday and given a prescription of prednisone after she was diagnosed with arthritis of the right knee, but reports the current pain did not start until she was at work last night. She is employed as a Advertising copywriter.   The history is provided by the patient. No language interpreter was used.    Past Medical History:  Diagnosis Date  . Allergy   . Thyroid disease     Patient Active Problem List   Diagnosis Date Noted  . Hypothyroidism 05/23/2016  . Esophageal reflux 08/05/2015  . 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 Goiter 08/05/2015  . Hyperthyroidism 08/05/2015    Past Surgical History:  Procedure Laterality Date  . DILATION AND CURETTAGE OF UTERUS      OB History    No data available       Home Medications    Prior to Admission medications   Medication Sig Start Date End Date Taking? Authorizing Provider  acetaminophen (TYLENOL) 325 MG tablet Take 650 mg by mouth every 6 (six) hours as needed.    [provider]  CALCIUM PO Take 1 tablet by mouth daily.    [provider]  cetirizine (ZYRTEC) 10 MG tablet Take 1 tablet (10 mg total) by mouth daily. 05/23/16   Benjiman Core D, PA-C  Cholecalciferol (VITAMIN D PO) Take 1 capsule by mouth daily.    [provider]  cyclobenzaprine (FLEXERIL)  5 MG tablet Take 1 tablet (5 mg total) by mouth 3 (three) times daily as needed for muscle spasms. 09/02/16   Benjiman Core D, PA-C  diclofenac sodium (VOLTAREN) 1 % GEL Apply 2 g topically 4 (four) times daily. 10/21/16   Makenly Larabee A, PA-C  fluticasone (FLONASE) 50 MCG/ACT nasal spray Place 2 sprays into both nostrils daily. 05/23/16   Benjiman Core D, PA-C  levothyroxine (SYNTHROID, LEVOTHROID) 25 MCG tablet Take 1 tablet (25 mcg total) by mouth daily before breakfast. 06/02/16   Benjiman Core D, PA-C  meloxicam (MOBIC) 7.5 MG tablet Take 1 tablet (7.5 mg total) by mouth daily. 09/02/16   Benjiman Core D, PA-C  methocarbamol (ROBAXIN) 500 MG tablet Take 1 tablet (500 mg total) by mouth 2 (two) times daily. 10/21/16   Melynda Krzywicki A, PA-C  pantoprazole (PROTONIX) 40 MG tablet Take 1 tablet (40 mg total) by mouth daily. 02/04/16   Doristine Bosworth, MD  predniSONE (DELTASONE) 20 MG tablet Take 3 PO QAM x3days, 2 PO QAM x3days, 1 PO QAM x3days 10/20/16   Magdalene River, PA-C  Simethicone 40 MG/0.6ML LIQD Take 1.2 mLs (80 mg total) by mouth every 6 (six) hours as needed. 03/10/16   Doristine Bosworth, MD    Family History History reviewed. No pertinent family history.  Social History Social History  Substance Use Topics  . Smoking  status: Former Smoker    Quit date: 05/30/2013  . Smokeless tobacco: Never Used     Comment: smoked 2-3 cigarettes a day for 5 years  . Alcohol use No     Allergies   Patient has no known allergies.   Review of Systems Review of Systems  Constitutional: Negative for activity change.  Respiratory: Negative for shortness of breath.   Cardiovascular: Negative for chest pain.  Gastrointestinal: Negative for abdominal pain.  Musculoskeletal: Positive for arthralgias, gait problem and myalgias. Negative for back pain.  Skin: Negative for rash.  Neurological: Negative for weakness and numbness.     Physical Exam Updated Vital Signs BP (!) 149/66 (BP  Location: Right Arm)   Pulse 80   Temp 98.2 F (36.8 C) (Oral)   Resp 18   SpO2 98%   Physical Exam  Constitutional: No distress.  HENT:  Head: Normocephalic.  Eyes: Conjunctivae are normal.  Neck: Neck supple.  Cardiovascular: Normal rate and regular rhythm.  Exam reveals no gallop and no friction rub.   No murmur heard. Pulmonary/Chest: Effort normal. No respiratory distress.  Abdominal: Soft. She exhibits no distension.  Musculoskeletal:  Tender to palpation over the medial aspect of the right knee. No medial or lateral joint line tenderness. Mild crepitus with tracking of the patella. Negative anterior and posterior drawer test. Negative valgus and varus stress test. Sensation is intact throughout the bilateral lower extremities. 2+ DP and PT pulses. 5 out of 5 strength of the bilateral lower extremities. Antalgic gait.  Neurological: She is alert.  Skin: Skin is warm. No rash noted.  Psychiatric: Her behavior is normal.  Nursing note and vitals reviewed.    ED Treatments / Results  Labs (all labs ordered are listed, but only abnormal results are displayed) Labs Reviewed - No data to display  EKG  EKG Interpretation None       Radiology Dg Lumbar Spine Complete  Result Date: 10/20/2016 CLINICAL DATA:  Low back pain for the past 4 months with burning sensations down the right leg and in the right knee. EXAM: LUMBAR SPINE - COMPLETE 4+ VIEW COMPARISON:  04/09/2013. FINDINGS: Six non-rib-bearing vertebrae in the lumbar region. Mild to moderate anterior spur formation at multiple levels of the lumbar and lower thoracic spine. Facet degenerative changes in the lower lumbar spine. No fractures, pars defects or subluxations. IMPRESSION: Mild to moderate multilevel degenerative changes, without significant change. No acute abnormality. Electronically Signed   By: Beckie Salts M.D.   On: 10/20/2016 18:18   Dg Knee Complete 4 Views Right  Result Date: 10/21/2016 CLINICAL DATA:   Injury while sitting with sharp right knee pain. EXAM: RIGHT KNEE - COMPLETE 4+ VIEW COMPARISON:  10/20/2016 FINDINGS: No acute fracture or dislocation. No significant joint effusion. Minimal tricompartmental osteoarthritic change. IMPRESSION: No acute findings. Minimal osteoarthritic change. Electronically Signed   By: Elberta Fortis M.D.   On: 10/21/2016 16:08   Dg Knee Complete 4 Views Right  Result Date: 10/20/2016 CLINICAL DATA:  Burning sensations in the right knee and leg. EXAM: RIGHT KNEE - COMPLETE 4+ VIEW COMPARISON:  None. FINDINGS: Mild spur formation involving all 3 joint compartments. No effusion. IMPRESSION: Mild tricompartmental degenerative changes.  No acute abnormality. Electronically Signed   By: Beckie Salts M.D.   On: 10/20/2016 18:18    Procedures Procedures (including critical care time)  Medications Ordered in ED Medications  oxyCODONE-acetaminophen (PERCOCET/ROXICET) 5-325 MG per tablet 1 tablet (1 tablet Oral Given 10/21/16 1507)  Initial Impression / Assessment and Plan / ED Course  I have reviewed the triage vital signs and the nursing notes.  Pertinent labs & imaging results that were available during my care of the patient were reviewed by me and considered in my medical decision making (see chart for details).     46 year old female with atraumatic right knee pain. X-ray negative for lesions or fracture. Pt advised to follow up with orthopedics if symptoms persist for possibility of missed fracture diagnosis. Patient given brace while in ED, conservative therapy recommended and discussed. She declined crutches at this time. Patient will be dc home & is agreeable with above plan.   Final Clinical Impressions(s) / ED Diagnoses   Final diagnoses:  Acute pain of right knee    New Prescriptions New Prescriptions   DICLOFENAC SODIUM (VOLTAREN) 1 % GEL    Apply 2 g topically 4 (four) times daily.   METHOCARBAMOL (ROBAXIN) 500 MG TABLET    Take 1 tablet  (500 mg total) by mouth 2 (two) times daily.     Frederik Pear A, PA-C 10/21/16 1807    Vanetta Mulders, MD 10/25/16 [phone]

## 2016-10-21 NOTE — ED Notes (Signed)
ED Provider at bedside. 

## 2016-10-23 ENCOUNTER — Other Ambulatory Visit: Payer: Self-pay | Admitting: Physician Assistant

## 2016-10-23 MED ORDER — LEVOTHYROXINE SODIUM 75 MCG PO TABS: 75.0000 ug | ORAL_TABLET | Freq: Every day | ORAL | 0 refills | Status: AC

## 2016-10-23 NOTE — Progress Notes (Signed)
Meds ordered this encounter  Medications  . levothyroxine (SYNTHROID, LEVOTHROID) 75 MCG tablet    Sig: Take 1 tablet (75 mcg total) by mouth daily before breakfast.    Dispense:  60 tablet    Refill:  0    Order Specific Question:   Supervising Provider    Answer:   Ethelda Chick [[id number]]

## 2017-07-27 ENCOUNTER — Ambulatory Visit (INDEPENDENT_AMBULATORY_CARE_PROVIDER_SITE_OTHER): Payer: BLUE CROSS/BLUE SHIELD

## 2017-07-27 ENCOUNTER — Encounter: Payer: Self-pay | Admitting: Physician Assistant

## 2017-07-27 ENCOUNTER — Ambulatory Visit: Payer: BLUE CROSS/BLUE SHIELD | Admitting: Physician Assistant

## 2017-07-27 VITALS — BP 120/80 | HR 79 | Temp 98.7°F | Resp 17 | Ht 66.0 in | Wt 221.0 lb

## 2017-07-27 DIAGNOSIS — K5900 Constipation, unspecified: Secondary | ICD-10-CM

## 2017-07-27 DIAGNOSIS — R1013 Epigastric pain: Secondary | ICD-10-CM | POA: Diagnosis not present

## 2017-07-27 DIAGNOSIS — K219 Gastro-esophageal reflux disease without esophagitis: Secondary | ICD-10-CM

## 2017-07-27 MED ORDER — SIMETHICONE 40 MG/0.6ML PO LIQD: 80.0000 mg | Freq: Four times a day (QID) | ORAL | 6 refills | Status: DC | PRN

## 2017-07-27 MED ORDER — POLYETHYLENE GLYCOL 3350 17 GM/SCOOP PO POWD: 17.0000 g | Freq: Two times a day (BID) | ORAL | 1 refills | Status: DC | PRN

## 2017-07-27 MED ORDER — PANTOPRAZOLE SODIUM 40 MG PO TBEC: 40.0000 mg | DELAYED_RELEASE_TABLET | Freq: Every day | ORAL | 1 refills | Status: DC

## 2017-07-27 NOTE — Progress Notes (Signed)
[name]  MRN: [id number] DOB: 06/20/1970  PCP: Magdalene RiverWiseman, Brittany D, PA-C  Subjective:  Pt is a 47 year old female who presents to clinic for abdominal pain x 1 week. She is here today with her daughter.  Feels like she has to throw up. Pain is present with or without eating.   Nothing makes it worse.  Nothing makes it better.   She has vomited a few days ago.   She endorses symptoms of "burning" in the middle of her chest. This is intermittent. Strains with BM. Last BM this morning.  Food does not feel like it gets stuck.  Denies blood in stool, decreased appetite She eats rice, spaghetti, fruit, salad, egg.   She has had problems with gas before. She has been using Simethicone 20mg  drops. This helps a lot and she would like refill of this medication.   Review of Systems  Constitutional: Negative for chills, diaphoresis, fatigue and fever.  Gastrointestinal: Positive for abdominal pain and nausea. Negative for constipation, diarrhea and vomiting.  Musculoskeletal: Negative for back pain.    Patient Active Problem List   Diagnosis Date Noted  . Hypothyroidism 05/23/2016  . Esophageal reflux 08/05/2015  . 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 Goiter 08/05/2015  . Hyperthyroidism 08/05/2015    Current Outpatient Medications on File Prior to Visit  Medication Sig Dispense Refill  . acetaminophen (TYLENOL) 325 MG tablet Take 650 mg by mouth every 6 (six) hours as needed.    Marland Kitchen. CALCIUM PO Take 1 tablet by mouth daily.    . cetirizine (ZYRTEC) 10 MG tablet Take 1 tablet (10 mg total) by mouth daily. 90 tablet 1  . Cholecalciferol (VITAMIN D PO) Take 1 capsule by mouth daily.    . cyclobenzaprine (FLEXERIL) 5 MG tablet Take 1 tablet (5 mg total) by mouth 3 (three) times daily as needed for muscle spasms. 60 tablet 0  . diclofenac sodium (VOLTAREN) 1 % GEL Apply 2 g topically 4 (four) times daily. 100 g 0  . fluticasone (FLONASE) 50 MCG/ACT nasal spray Place 2 sprays into both nostrils  daily. 16 g 6  . levothyroxine (SYNTHROID, LEVOTHROID) 75 MCG tablet Take 1 tablet (75 mcg total) by mouth daily before breakfast. 60 tablet 0  . meloxicam (MOBIC) 7.5 MG tablet Take 1 tablet (7.5 mg total) by mouth daily. 30 tablet 0  . methocarbamol (ROBAXIN) 500 MG tablet Take 1 tablet (500 mg total) by mouth 2 (two) times daily. 20 tablet 0  . pantoprazole (PROTONIX) 40 MG tablet Take 1 tablet (40 mg total) by mouth daily. 30 tablet 3  . predniSONE (DELTASONE) 20 MG tablet Take 3 PO QAM x3days, 2 PO QAM x3days, 1 PO QAM x3days 18 tablet 0  . Simethicone 40 MG/0.6ML LIQD Take 1.2 mLs (80 mg total) by mouth every 6 (six) hours as needed. 30 mL 6   No current facility-administered medications on file prior to visit.     No Known Allergies   Objective:  BP 120/80   Pulse 79   Temp 98.7 F (37.1 C) (Oral)   Resp 17   Ht 5\' 6"  (1.676 m)   Wt 221 lb (100.2 kg)   SpO2 98%   BMI 35.67 kg/m   Physical Exam  Constitutional: She is oriented to person, place, and time. No distress.  Abdominal: Soft. Normal appearance. There is generalized tenderness. There is no rigidity, no guarding, no tenderness at McBurney's point and negative Murphy's sign.  Neurological: She is alert  and oriented to person, place, and time.  Skin: Skin is warm and dry.  Psychiatric: Judgment normal.  Vitals reviewed.   Assessment and Plan :  1. Epigastric pain - Pt presents c/o abdominal pain. She is a poor historian with occasional contradictory HPI details. Unsure etiology of abdominal pain.  H Pylori is pending. Advised pt to start Miralax daily x 1-2 weeks for possible constipation. RTC in 3-4 weeks if no improvement.  - H. pylori breath test - DG Abd 2 Views; Future - Simethicone 40 MG/0.6ML LIQD; Take 1.2 mLs (80 mg total) by mouth every 6 (six) hours as needed.  Dispense: 30 mL; Refill: 6  2. Constipation, unspecified constipation type - polyethylene glycol powder (GLYCOLAX/MIRALAX) powder; Take 17 g by  mouth 2 (two) times daily as needed.  Dispense: 578 g; Refill: 1  3. Gastroesophageal reflux disease, esophagitis presence not specified - pantoprazole (PROTONIX) 40 MG tablet; Take 1 tablet (40 mg total) by mouth daily. Take 30 min before the first meal of the day  Dispense: 30 tablet; Refill: 1   Whitney Shonnie Poudrier, PA-C  [facility] 07/27/2017 10:16 AM

## 2017-07-27 NOTE — Patient Instructions (Addendum)
   I am treating you today for reflux and constipation. Start taking Pantoprazole 40mg  every day 30 min before the first meal of the day.  Come back and see me in 4-6 weeks to check in.    For constipation  Look up "6 Reasons to Care about Poop Health" at [employer] (http://www.edwards.info/www.precisionnutrition.com/blog/infographics)  1) Water: Make sure you are drinking enough water daily - about 1-3 liters. 2) Fiber: Make sure you are getting enough fiber in your diet - this will make you regular - you can eat high fiber foods or use metamucil as a supplement - it is really important to drink enough water when using fiber supplements. Foods that have a lot of fiber include vegetables, fruits, beans, nuts, oatmeal, and some breads and cereals. You can tell how much fiber is in a food by reading the nutrition label. Doctors recommend eating 25 to 36 grams of fiber each day. 3) Fitness: Increasing your physical activity will help increase the natural movement of your bowels. Try to get 20-30 minutes of exercise daily.  4) Use a 9" stool in front of your toilet to rest your feet on while you have a bowel movement. This will loosen your rectal muscles to help your stool come out easier and prevent straining.   Option 1) For gentle treatment of constipation Use Miralax 1-2 capfuls a day until your stools are soft and regular and then decrease the usage - you can use this daily. Do this for at least 1-2 weeks.   Option 2) For more aggressive treatment of constipation Use 4 capfuls of Colace and 6 doses of Miralax and drink it in 2 hours - this should result in several watery stools - if it does not repeat the next day and then go to daily miralax for a week to make sure your bowels are clean and retrained to work properly  Option 3) For the most aggressive treatment of constipation Use 14 capfuls of Miralax in 1 gallon of fluid (gatoraid or water work well or a combination of the two) and drink over 12h - it is  ok to eat during this time and then use Miralax 1 capful daily for about 2 weeks to prevent the constipation from returning    IF you received an x-ray today, you will receive an invoice from [facility]. Please contact [facility] at [phone] with questions or concerns regarding your invoice.   IF you received labwork today, you will receive an invoice from [facility]. Please contact [facility] at [phone] with questions or concerns regarding your invoice.   Our billing staff will not be able to assist you with questions regarding bills from these companies.  You will be contacted with the lab results as soon as they are available. The fastest way to get your results is to activate your My Chart account. Instructions are located on the last page of this paperwork. If you have not heard from us regarding the results in 2 weeks, please contact this office.

## 2017-07-29 LAB — H. PYLORI BREATH TEST: H pylori Breath Test: POSITIVE — AB

## 2017-07-31 NOTE — Progress Notes (Signed)
Lab pool - please call pt and let her know she tested positive for H Pylori-- a bacteria in her stomach. She needs to come in for a follow-up visit with me to discuss treatment.  Scheduling pool- please call pt and schedule her for a f/u positive H Pylori test.  Thank you!

## 2017-08-10 ENCOUNTER — Ambulatory Visit: Payer: BLUE CROSS/BLUE SHIELD | Admitting: Physician Assistant

## 2017-08-10 ENCOUNTER — Encounter: Payer: Self-pay | Admitting: Physician Assistant

## 2017-08-10 VITALS — BP 108/72 | HR 84 | Temp 98.0°F | Resp 17 | Ht 66.0 in | Wt 222.0 lb

## 2017-08-10 DIAGNOSIS — A048 Other specified bacterial intestinal infections: Secondary | ICD-10-CM

## 2017-08-10 MED ORDER — CLARITHROMYCIN 500 MG PO TABS: 500.0000 mg | ORAL_TABLET | Freq: Two times a day (BID) | ORAL | 0 refills | Status: AC

## 2017-08-10 MED ORDER — AMOXICILLIN 500 MG PO CAPS: 1000.0000 mg | ORAL_CAPSULE | Freq: Two times a day (BID) | ORAL | 0 refills | Status: AC

## 2017-08-10 MED ORDER — METRONIDAZOLE 500 MG PO TABS: 500.0000 mg | ORAL_TABLET | Freq: Two times a day (BID) | ORAL | 0 refills | Status: AC

## 2017-08-10 NOTE — Patient Instructions (Addendum)
H Pylori: You will take all of the following medications starting tomorrow.    MEDICATION       FREQUENCY       LENGTH of Treatment Pantoprazole (40 mg)  Once daily 14 days   Clarithromycin (500 mg) Twice daily    Amoxicillin (1 gram) Twice daily    Metronidazole (500 mg) Twice daily     Tests to confirm that the bacteria is gone should be performed in all patients treated for H. pylori. Eradication testing should be performed four weeks or more after completion of antibiotic therapy. Omeprazole should be stopped for 2 weeks prior to testing.    Helicobacter Pylori Infection Helicobacter pylori infection is an infection in the stomach that is caused by the Helicobacter pylori (H. pylori) bacteria. This type of bacteria often lives in the lining of the stomach. The infection can cause ulcers and irritation (gastritis) in some people. It is the most common cause of ulcers in the stomach (gastric ulcer) and in the upper part of the intestine (duodenal ulcer). Having this infection may also increase the risk of stomach cancer and a type of white blood cell cancer (lymphoma) that affects the stomach. What are the causes? H. pylori is a type of bacteria that is often found in the stomachs of healthy people. The bacteria may be passed from person to person through contact with stool or saliva. It is not known why some people develop ulcers, gastritis, or cancer from the infection. What increases the risk? This condition is more likely to develop in people who:  Have family members with the infection.  Live with many other people, such as in a dormitory.  Are of African, Hispanic, or Asian descent.  What are the signs or symptoms? Most people with this infection do not have symptoms. If you do have symptoms, they may include:  Heartburn.  Stomach pain.  Nausea.  Vomiting.  Blood-tinged vomit.  Loss of appetite.  Bad breath.  How is this diagnosed? This condition may be diagnosed  based on your symptoms, a physical exam, and various tests. Tests may include:  Blood tests or stool tests to check for the proteins (antibodies) that your body may produce in response to the bacteria. These tests are the best way to confirm the diagnosis.  A breath test to check for the type of gas that the H. pylori bacteria release after breaking down a substance called urea. For the test, you are asked to drink urea. This test is often done after treatment in order to find out if the treatment worked.  A procedure in which a thin, flexible tube with a tiny camera at the end is placed into your stomach and upper intestine (upper endoscopy). Your health care provider may also take tissue samples (biopsy) to test for H. pylori and cancer.  How is this treated? Treatment for this condition usually involves taking a combination of medicines (triple therapy) for a couple of weeks. Triple therapy includes one medicine to reduce the acid in your stomach and two types of antibiotic medicines. Many drug combinations have been approved for treatment. Treatment usually kills the H. pylori and reduces your risk of cancer. You may need to be tested for H. pylori again after treatment. In some cases, the treatment may need to be repeated. Follow these instructions at home:  Take over-the-counter and prescription medicines only as told by your health care provider.  Take your antibiotics as told by your health care provider. Do  not stop taking the antibiotics even if you start to feel better.  You can do all your usual activities and eat what you usually do.  Take steps to prevent future infections: ? Wash your hands often. ? Make sure the food you eat has been properly prepared. ? Drink water only from clean sources.  Keep all follow-up visits as told by your health care provider. This is important. Contact a health care provider if:  Your symptoms do not get better.  Your symptoms return after  treatment. This information is not intended to replace advice given to you by your health care provider. Make sure you discuss any questions you have with your health care provider. Document Released: 05/10/2015 Document Revised: 06/24/2015 Document Reviewed: 01/28/2014 Elsevier Interactive Patient Education  2018 [employer]     IF you received an x-ray today, you will receive an invoice from [facility]. Please contact [facility] at [phone] with questions or concerns regarding your invoice.   IF you received labwork today, you will receive an invoice from [facility]. Please contact [facility] at [phone] with questions or concerns regarding your invoice.   Our billing staff will not be able to assist you with questions regarding bills from these companies.  You will be contacted with the lab results as soon as they are available. The fastest way to get your results is to activate your My Chart account. Instructions are located on the last page of this paperwork. If you have not heard from us regarding the results in 2 weeks, please contact this office.

## 2017-08-10 NOTE — Progress Notes (Signed)
[name]  MRN: [id number] DOB: [birth year]  PCP: Magdalene River, PA-C  Subjective:  Pt presents to clinic for H. pylori follow-up.  She is here today with her daughter.  She was here 6/28 complaining of abdominal pain.  She tested positive for H. pylori.  Today she says she is feeling better after taking pantoprazole daily.  She is no longer taking pantoprazole currently.  Review of Systems  Constitutional: Negative for chills, diaphoresis, fatigue and fever.  Gastrointestinal: Positive for abdominal pain. Negative for diarrhea and vomiting.    Patient Active Problem List   Diagnosis Date Noted  . Hypothyroidism 05/23/2016  . Esophageal reflux 08/05/2015  . 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 Goiter 08/05/2015  . Hyperthyroidism 08/05/2015    Current Outpatient Medications on File Prior to Visit  Medication Sig Dispense Refill  . acetaminophen (TYLENOL) 325 MG tablet Take 650 mg by mouth every 6 (six) hours as needed.    Marland Kitchen CALCIUM PO Take 1 tablet by mouth daily.    . cetirizine (ZYRTEC) 10 MG tablet Take 1 tablet (10 mg total) by mouth daily. 90 tablet 1  . Cholecalciferol (VITAMIN D PO) Take 1 capsule by mouth daily.    . cyclobenzaprine (FLEXERIL) 5 MG tablet Take 1 tablet (5 mg total) by mouth 3 (three) times daily as needed for muscle spasms. 60 tablet 0  . diclofenac sodium (VOLTAREN) 1 % GEL Apply 2 g topically 4 (four) times daily. 100 g 0  . fluticasone (FLONASE) 50 MCG/ACT nasal spray Place 2 sprays into both nostrils daily. 16 g 6  . levothyroxine (SYNTHROID, LEVOTHROID) 75 MCG tablet Take 1 tablet (75 mcg total) by mouth daily before breakfast. 60 tablet 0  . meloxicam (MOBIC) 7.5 MG tablet Take 1 tablet (7.5 mg total) by mouth daily. 30 tablet 0  . methocarbamol (ROBAXIN) 500 MG tablet Take 1 tablet (500 mg total) by mouth 2 (two) times daily. 20 tablet 0  . pantoprazole (PROTONIX) 40 MG tablet Take 1 tablet (40 mg total) by mouth daily. Take 30 min before the first  meal of the day 30 tablet 1  . polyethylene glycol powder (GLYCOLAX/MIRALAX) powder Take 17 g by mouth 2 (two) times daily as needed. 578 g 1  . predniSONE (DELTASONE) 20 MG tablet Take 3 PO QAM x3days, 2 PO QAM x3days, 1 PO QAM x3days 18 tablet 0  . Simethicone 40 MG/0.6ML LIQD Take 1.2 mLs (80 mg total) by mouth every 6 (six) hours as needed. 30 mL 6   No current facility-administered medications on file prior to visit.     No Known Allergies   Objective:  BP 108/72   Pulse 84   Temp 98 F (36.7 C) (Oral)   Resp 17   Ht 5\' 6"  (1.676 m)   Wt 222 lb (100.7 kg)   SpO2 98%   BMI 35.83 kg/m   Physical Exam  Constitutional: She is oriented to person, place, and time. No distress.  Cardiovascular: Normal rate, regular rhythm and normal heart sounds.  Abdominal: Soft. There is no tenderness.  Neurological: She is alert and oriented to person, place, and time.  Skin: Skin is warm and dry.  Psychiatric: Judgment normal.  Vitals reviewed.   Assessment and Plan :  1. H. pylori infection -Patient tested positive for H. pylori last month.  Plan to treat with clarithromycin triple therapy.  Discussed treatment with patient.  She is to return in September for recheck. - clarithromycin (BIAXIN) 500 MG  tablet; Take 1 tablet (500 mg total) by mouth 2 (two) times daily for 14 days.  Dispense: 28 tablet; Refill: 0 - amoxicillin (AMOXIL) 500 MG capsule; Take 2 capsules (1,000 mg total) by mouth 2 (two) times daily for 14 days.  Dispense: 56 capsule; Refill: 0 - metroNIDAZOLE (FLAGYL) 500 MG tablet; Take 1 tablet (500 mg total) by mouth 2 (two) times daily for 14 days.  Dispense: 28 tablet; Refill: 0   Marco CollieWhitney Vale Mousseau, PA-C  Primary Care at [facility] 08/10/2017 10:12 AM

## 2017-10-05 ENCOUNTER — Other Ambulatory Visit: Payer: Self-pay

## 2017-10-05 ENCOUNTER — Encounter: Payer: Self-pay | Admitting: Physician Assistant

## 2017-10-05 ENCOUNTER — Ambulatory Visit: Payer: BLUE CROSS/BLUE SHIELD | Admitting: Physician Assistant

## 2017-10-05 VITALS — BP 126/70 | HR 72 | Temp 98.5°F | Resp 16 | Ht 65.25 in | Wt 222.2 lb

## 2017-10-05 DIAGNOSIS — Z8619 Personal history of other infectious and parasitic diseases: Secondary | ICD-10-CM | POA: Diagnosis not present

## 2017-10-05 NOTE — Patient Instructions (Addendum)
  I will contact you with the results of today's lab tests.    Food Choices for Gastroesophageal Reflux Disease, Adult When you have gastroesophageal reflux disease (GERD), the foods you eat and your eating habits are very important. Choosing the right foods can help ease your discomfort. What guidelines do I need to follow?  Choose fruits, vegetables, whole grains, and low-fat dairy products.  Choose low-fat meat, fish, and poultry.  Limit fats such as oils, salad dressings, butter, nuts, and avocado.  Keep a food diary. This helps you identify foods that cause symptoms.  Avoid foods that cause symptoms. These may be different for everyone.  Eat small meals often instead of 3 large meals a day.  Eat your meals slowly, in a place where you are relaxed.  Limit fried foods.  Cook foods using methods other than frying.  Avoid drinking alcohol.  Avoid drinking large amounts of liquids with your meals.  Avoid bending over or lying down until 2-3 hours after eating. What foods are not recommended? These are some foods and drinks that may make your symptoms worse: Vegetables Tomatoes. Tomato juice. Tomato and spaghetti sauce. Chili peppers. Onion and garlic. Horseradish. Fruits Oranges, grapefruit, and lemon (fruit and juice). Meats High-fat meats, fish, and poultry. This includes hot dogs, ribs, ham, sausage, salami, and bacon. Dairy Whole milk and chocolate milk. Sour cream. Cream. Butter. Ice cream. Cream cheese. Drinks Coffee and tea. Bubbly (carbonated) drinks or energy drinks. Condiments Hot sauce. Barbecue sauce. Sweets/Desserts Chocolate and cocoa. Donuts. Peppermint and spearmint. Fats and Oils High-fat foods. This includes [place] fries and potato chips. Other Vinegar. Strong spices. This includes black pepper, white pepper, red pepper, cayenne, curry powder, cloves, ginger, and chili powder. The items listed above may not be a complete list of foods and drinks to  avoid. Contact your dietitian for more information. This information is not intended to replace advice given to you by your health care provider. Make sure you discuss any questions you have with your health care provider. Document Released: 07/18/2011 Document Revised: 06/24/2015 Document Reviewed: 11/20/2012 Elsevier Interactive Patient Education  [employer].  If you have lab work done today you will be contacted with your lab results within the next 2 weeks.  If you have not heard from [place] then please contact us. The fastest way to get your results is to register for My Chart.   IF you received an x-ray today, you will receive an invoice from [facility]. Please contact [facility] at [phone] with questions or concerns regarding your invoice.   IF you received labwork today, you will receive an invoice from [place]. Please contact [facility] at [phone] with questions or concerns regarding your invoice.   Our billing staff will not be able to assist you with questions regarding bills from these companies.  You will be contacted with the lab results as soon as they are available. The fastest way to get your results is to activate your My Chart account. Instructions are located on the last page of this paperwork. If you have not heard from [place] regarding the results in 2 weeks, please contact this office.

## 2017-10-05 NOTE — Progress Notes (Signed)
[name]  MRN: [id number] DOB: Feb 12, 1970  PCP: Magdalene River, PA-C  Subjective:  Pt is a 47 year old female who presents to clinic for f/u heartburn. She is here today with her daughter.   She was diagnosed with H Pylori and treated with clarithromycin, amoxicillin, Flagyl and Omeprazole.  she endorses medication compliance and finished her medications > 1 month ago.  Antibiotics caused her to "have a lot of poop" She is still having some chest burning after meals a few days a week. Not every day. Usually around lunch time while at work.   Review of Systems  Constitutional: Negative for chills and fever.  Cardiovascular: Negative for chest pain and palpitations.  Gastrointestinal: Positive for abdominal pain. Negative for diarrhea, nausea and vomiting.    Patient Active Problem List   Diagnosis Date Noted  . Hypothyroidism 05/23/2016  . Esophageal reflux 08/05/2015  . 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 Goiter 08/05/2015  . Hyperthyroidism 08/05/2015    Current Outpatient Medications on File Prior to Visit  Medication Sig Dispense Refill  . acetaminophen (TYLENOL) 325 MG tablet Take 650 mg by mouth every 6 (six) hours as needed.    Marland Kitchen levothyroxine (SYNTHROID, LEVOTHROID) 75 MCG tablet Take 1 tablet (75 mcg total) by mouth daily before breakfast. 60 tablet 0  . pantoprazole (PROTONIX) 40 MG tablet Take 1 tablet (40 mg total) by mouth daily. Take 30 min before the first meal of the day 30 tablet 1  . CALCIUM PO Take 1 tablet by mouth daily.    . cetirizine (ZYRTEC) 10 MG tablet Take 1 tablet (10 mg total) by mouth daily. (Patient not taking: Reported on 10/05/2017) 90 tablet 1  . Cholecalciferol (VITAMIN D PO) Take 1 capsule by mouth daily.    . cyclobenzaprine (FLEXERIL) 5 MG tablet Take 1 tablet (5 mg total) by mouth 3 (three) times daily as needed for muscle spasms. (Patient not taking: Reported on 10/05/2017) 60 tablet 0  . diclofenac sodium (VOLTAREN) 1 % GEL Apply 2 g  topically 4 (four) times daily. (Patient not taking: Reported on 10/05/2017) 100 g 0  . fluticasone (FLONASE) 50 MCG/ACT nasal spray Place 2 sprays into both nostrils daily. (Patient not taking: Reported on 10/05/2017) 16 g 6  . meloxicam (MOBIC) 7.5 MG tablet Take 1 tablet (7.5 mg total) by mouth daily. (Patient not taking: Reported on 10/05/2017) 30 tablet 0  . methocarbamol (ROBAXIN) 500 MG tablet Take 1 tablet (500 mg total) by mouth 2 (two) times daily. (Patient not taking: Reported on 10/05/2017) 20 tablet 0   No current facility-administered medications on file prior to visit.     No Known Allergies   Objective:  BP 126/70 (BP Location: Right Arm, Patient Position: Sitting, Cuff Size: Large)   Pulse 72   Temp 98.5 F (36.9 C) (Oral)   Resp 16   Ht 5' 5.25" (1.657 m)   Wt 222 lb 3.2 oz (100.8 kg)   SpO2 100%   BMI 36.69 kg/m   Physical Exam  Constitutional: She is oriented to person, place, and time. No distress.  Cardiovascular: Normal rate, regular rhythm and normal heart sounds.  Neurological: She is alert and oriented to person, place, and time.  Skin: Skin is warm and dry.  Psychiatric: Judgment normal.  Vitals reviewed.   Assessment and Plan :  1. History of Helicobacter pylori infection - Pt returns for recheck of H Pylori after clarithromycin triple therapy. She endorses improvement of symptoms, however is  experiencing occasional reflux symptoms 1-2x/week after lunch. Will contact with H Pylori results. Food avoidance for reflux printed out for pt.  - H. pylori breath test   Marco Collie, PA-C  Primary Care at [facility] 10/05/2017 11:15 AM  Please note: Portions of this report may have been transcribed using dragon voice recognition software. Every effort was made to ensure accuracy; however, inadvertent computerized transcription errors may be present.

## 2017-10-06 LAB — H. PYLORI BREATH COLLECTION

## 2017-10-06 LAB — H. PYLORI BREATH TEST: H pylori Breath Test: NEGATIVE

## 2017-10-10 ENCOUNTER — Encounter: Payer: Self-pay | Admitting: Physician Assistant

## 2017-11-24 ENCOUNTER — Encounter: Payer: Self-pay | Admitting: Physician Assistant

## 2017-11-24 ENCOUNTER — Ambulatory Visit (INDEPENDENT_AMBULATORY_CARE_PROVIDER_SITE_OTHER): Payer: BLUE CROSS/BLUE SHIELD | Admitting: Physician Assistant

## 2017-11-24 ENCOUNTER — Other Ambulatory Visit: Payer: Self-pay

## 2017-11-24 VITALS — BP 130/82 | HR 68 | Temp 97.9°F | Ht 65.5 in | Wt 225.0 lb

## 2017-11-24 DIAGNOSIS — M545 Low back pain, unspecified: Secondary | ICD-10-CM

## 2017-11-24 DIAGNOSIS — G8929 Other chronic pain: Secondary | ICD-10-CM | POA: Diagnosis not present

## 2017-11-24 DIAGNOSIS — Z131 Encounter for screening for diabetes mellitus: Secondary | ICD-10-CM | POA: Diagnosis not present

## 2017-11-24 MED ORDER — METHOCARBAMOL 500 MG PO TABS: 500.0000 mg | ORAL_TABLET | Freq: Two times a day (BID) | ORAL | 0 refills | Status: DC

## 2017-11-24 NOTE — Patient Instructions (Addendum)
I recommend resting today. However, tomorrow I would begin walking and moving around as much as tolerated. Begin stretching in a couple of days. The worse thing you can do for low back pain is lie in bed all day or sit down all day. Use medications as needed.   Just to know, robaxin can cause side effects that may impair your thinking or reactions. Be careful if you drive or do anything that requires you to be awake and alert. void drinking alcohol, which can increase some of the side effects of robaxin.  Use voltaren gel on back and knees.   You should avoid heavy lifting or strenuous repetitive activity to prevent recurrence of event. Experiment with both ice and heat and choose whichever feels best for you.  Use heat pad or ice pack, do not apply directly to skin, use barrier such as towel over the skin. Leave on for 15-20 minutes, 3-4 times a day.  Please perform exercises below. Stretches are to be performed for 2 sets, holding 10-15 seconds each. Recommended to perform this rehab twice daily within pain tolerance for 2 weeks.   FLEXION RANGE OF MOTION AND STRETCHING EXERCISES: STRETCH - Flexion, Single Knee to Chest   Lie on a firm bed or floor with both legs extended in front of you.  Keeping one leg in contact with the floor, bring your opposite knee to your chest. Hold your leg in place by either grabbing behind your thigh or at your knee.  Pull until you feel a gentle stretch in your lower back.   Slowly release your grasp and repeat the exercise with the opposite side.  STRETCH - Flexion, Double Knee to Chest   Lie on a firm bed or floor with both legs extended in front of you.  Keeping one leg in contact with the floor, bring your opposite knee to your chest.  Tense your stomach muscles to support your back and then lift your other knee to your chest. Hold your legs in place by either grabbing behind your thighs or at your knees.  Pull both knees toward your chest until you  feel a gentle stretch in your lower back.   Tense your stomach muscles and slowly return one leg at a time to the floor.  STRETCH - Low Trunk Rotation  Lie on a firm bed or floor. Keeping your legs in front of you, bend your knees so they are both pointed toward the ceiling and your feet are flat on the floor.  Extend your arms out to the side. This will stabilize your upper body by keeping your shoulders in contact with the floor.  Gently and slowly drop both knees together to one side until you feel a gentle stretch in your lower back.   Tense your stomach muscles to support your lower back as you bring your knees back to the starting position. Repeat the exercise to the other side.   EXTENSION RANGE OF MOTION AND FLEXIBILITY EXERCISES: STRETCH - Extension, Prone on Elbows   Lie on your stomach on the floor, a bed will be too soft. Place your palms about shoulder width apart and at the height of your head.  Place your elbows under your shoulders. If this is too painful, stack pillows under your chest.  Allow your body to relax so that your hips drop lower and make contact more completely with the floor.  Slowly return to lying flat on the floor.  RANGE OF MOTION - Extension, Prone  Press Ups  Lie on your stomach on the floor, a bed will be too soft. Place your palms about shoulder width apart and at the height of your head.  Keeping your back as relaxed as possible, slowly straighten your elbows while keeping your hips on the floor. You may adjust the placement of your hands to maximize your comfort. As you gain motion, your hands will come more underneath your shoulders.  Slowly return to lying flat on the floor.  RANGE OF MOTION- Quadruped, Neutral Spine   Assume a hands and knees position on a firm surface. Keep your hands under your shoulders and your knees under your hips. You may place padding under your knees for comfort.  Drop your head and point your tail bone toward  the ground below you. This will round out your lower back like an angry cat.    Slowly lift your head and release your tail bone so that your back sags into a large arch, like an old horse.  Repeat this until you feel limber in your lower back.  Now, find your "sweet spot." This will be the most comfortable position somewhere between the two previous positions. This is your neutral spine. Once you have found this position, tense your stomach muscles to support your lower back.  STRENGTHENING EXERCISES - Low Back Strain These exercises may help you when beginning to rehabilitate your injury. These exercises should be done near your "sweet spot." This is the neutral, low-back arch, somewhere between fully rounded and fully arched, that is your least painful position. When performed in this safe range of motion, these exercises can be used for people who have either a flexion or extension based injury. These exercises may resolve your symptoms with or without further involvement from your physician, physical therapist or athletic trainer. While completing these exercises, remember:   Muscles can gain both the endurance and the strength needed for everyday activities through controlled exercises.  Complete these exercises as instructed by your physician, physical therapist or athletic trainer. Increase the resistance and repetitions only as guided.  You may experience muscle soreness or fatigue, but the pain or discomfort you are trying to eliminate should never worsen during these exercises. If this pain does worsen, stop and make certain you are following the directions exactly. If the pain is still present after adjustments, discontinue the exercise until you can discuss the trouble with your caregiver.  STRENGTHENING - Deep Abdominals, Pelvic Tilt  Lie on a firm bed or floor. Keeping your legs in front of you, bend your knees so they are both pointed toward the ceiling and your feet are flat on  the floor.  Tense your lower abdominal muscles to press your lower back into the floor. This motion will rotate your pelvis so that your tail bone is scooping upwards rather than pointing at your feet or into the floor.  STRENGTHENING - Abdominals, Crunches   Lie on a firm bed or floor. Keeping your legs in front of you, bend your knees so they are both pointed toward the ceiling and your feet are flat on the floor. Cross your arms over your chest.  Slightly tip your chin down without bending your neck.  Tense your abdominals and slowly lift your trunk high enough to just clear your shoulder blades. Lifting higher can put excessive stress on the lower back and does not further strengthen your abdominal muscles.  Control your return to the starting position.  STRENGTHENING - Quadruped, Opposite  UE/LE Lift   Assume a hands and knees position on a firm surface. Keep your hands under your shoulders and your knees under your hips. You may place padding under your knees for comfort.  Find your neutral spine and gently tense your abdominal muscles so that you can maintain this position. Your shoulders and hips should form a rectangle that is parallel with the floor and is not twisted.  Keeping your trunk steady, lift your right hand no higher than your shoulder and then your left leg no higher than your hip. Make sure you are not holding your breath.   Continuing to keep your abdominal muscles tense and your back steady, slowly return to your starting position. Repeat with the opposite arm and leg.  STRENGTHENING - Lower Abdominals, Double Knee Lift  Lie on a firm bed or floor. Keeping your legs in front of you, bend your knees so they are both pointed toward the ceiling and your feet are flat on the floor.  Tense your abdominal muscles to brace your lower back and slowly lift both of your knees until they come over your hips. Be certain not to hold your breath.  POSTURE AND BODY MECHANICS  CONSIDERATIONS - Low Back Strain Keeping correct posture when sitting, standing or completing your activities will reduce the stress put on different body tissues, allowing injured tissues a chance to heal and limiting painful experiences. The following are general guidelines for improved posture. Your physician or physical therapist will provide you with any instructions specific to your needs. While reading these guidelines, remember:  The exercises prescribed by your provider will help you have the flexibility and strength to maintain correct postures.  The correct posture provides the best environment for your joints to work. All of your joints have less wear and tear when properly supported by a spine with good posture. This means you will experience a healthier, less painful body.  Correct posture must be practiced with all of your activities, especially prolonged sitting and standing. Correct posture is as important when doing repetitive low-stress activities (typing) as it is when doing a single heavy-load activity (lifting). RESTING POSITIONS Consider which positions are most painful for you when choosing a resting position. If you have pain with flexion-based activities (sitting, bending, stooping, squatting), choose a position that allows you to rest in a less flexed posture. You would want to avoid curling into a fetal position on your side. If your pain worsens with extension-based activities (prolonged standing, working overhead), avoid resting in an extended position such as sleeping on your stomach. Most people will find more comfort when they rest with their spine in a more neutral position, neither too rounded nor too arched. Lying on a non-sagging bed on your side with a pillow between your knees, or on your back with a pillow under your knees will often provide some relief. Keep in mind, being in any one position for a prolonged period of time, no matter how correct your posture, can  still lead to stiffness. PROPER SITTING POSTURE In order to minimize stress and discomfort on your spine, you must sit with correct posture. Sitting with good posture should be effortless for a healthy body. Returning to good posture is a gradual process. Many people can work toward this most comfortably by using various supports until they have the flexibility and strength to maintain this posture on their own. When sitting with proper posture, your ears will fall over your shoulders and your shoulders will  fall over your hips. You should use the back of the chair to support your upper back. Your lower back will be in a neutral position, just slightly arched. You may place a small pillow or folded towel at the base of your lower back for support.  When working at a desk, create an environment that supports good, upright posture. Without extra support, muscles tire, which leads to excessive strain on joints and other tissues. Keep these recommendations in mind: CHAIR:  A chair should be able to slide under your desk when your back makes contact with the back of the chair. This allows you to work closely.  The chair's height should allow your eyes to be level with the upper part of your monitor and your hands to be slightly lower than your elbows. BODY POSITION  Your feet should make contact with the floor. If this is not possible, use a foot rest.  Keep your ears over your shoulders. This will reduce stress on your neck and lower back. INCORRECT SITTING POSTURES  If you are feeling tired and unable to assume a healthy sitting posture, do not slouch or slump. This puts excessive strain on your back tissues, causing more damage and pain. Healthier options include:  Using more support, like a lumbar pillow.  Switching tasks to something that requires you to be upright or walking.  Talking a brief walk.  Lying down to rest in a neutral-spine position. PROLONGED STANDING WHILE SLIGHTLY LEANING  FORWARD  When completing a task that requires you to lean forward while standing in one place for a long time, place either foot up on a stationary 2-4 inch high object to help maintain the best posture. When both feet are on the ground, the lower back tends to lose its slight inward curve. If this curve flattens (or becomes too large), then the back and your other joints will experience too much stress, tire more quickly, and can cause pain. CORRECT STANDING POSTURES Proper standing posture should be assumed with all daily activities, even if they only take a few moments, like when brushing your teeth. As in sitting, your ears should fall over your shoulders and your shoulders should fall over your hips. You should keep a slight tension in your abdominal muscles to brace your spine. Your tailbone should point down to the ground, not behind your body, resulting in an over-extended swayback posture.  INCORRECT STANDING POSTURES  Common incorrect standing postures include a forward head, locked knees and/or an excessive swayback. WALKING Walk with an upright posture. Your ears, shoulders and hips should all line-up. PROLONGED ACTIVITY IN A FLEXED POSITION When completing a task that requires you to bend forward at your waist or lean over a low surface, try to find a way to stabilize 3 out of 4 of your limbs. You can place a hand or elbow on your thigh or rest a knee on the surface you are reaching across. This will provide you more stability so that your muscles do not fatigue as quickly. By keeping your knees relaxed, or slightly bent, you will also reduce stress across your lower back. CORRECT LIFTING TECHNIQUES DO :   Assume a wide stance. This will provide you more stability and the opportunity to get as close as possible to the object which you are lifting.  Tense your abdominals to brace your spine. Bend at the knees and hips. Keeping your back locked in a neutral-spine position, lift using your  leg muscles. Lift with  your legs, keeping your back straight.  Test the weight of unknown objects before attempting to lift them.  Try to keep your elbows locked down at your sides in order get the best strength from your shoulders when carrying an object.  Always ask for help when lifting heavy or awkward objects. INCORRECT LIFTING TECHNIQUES DO NOT:   Lock your knees when lifting, even if it is a small object.  Bend and twist. Pivot at your feet or move your feet when needing to change directions.  Assume that you can safely pick up even a paper clip without proper posture.     IF you received an x-ray today, you will receive an invoice from [facility]. Please contact [facility] at [phone] with questions or concerns regarding your invoice.   IF you received labwork today, you will receive an invoice from [facility]. Please contact [facility] at [phone] with questions or concerns regarding your invoice.   Our billing staff will not be able to assist you with questions regarding bills from these companies.  You will be contacted with the lab results as soon as they are available. The fastest way to get your results is to activate your My Chart account. Instructions are located on the last page of this paperwork. If you have not heard from [place] regarding the results in 2 weeks, please contact this office.

## 2017-11-24 NOTE — Progress Notes (Signed)
[name]  MRN: [id number] DOB: Oct 08, 1970  Subjective:  [name] is a 47 y.o. female seen in office today for a chief complaint of low back pain. The patient has had recurrent self limited episodes of low back pain in the past. Current symptoms have been present for 1 week and are unchanged.  Onset was related to / precipitated by no known injury. 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 The pain is located in the right lumbar area or left lumbar area and does not radiate. The pain is described as aching and muscle spasm and occurs all day. Symptoms are exacerbated by certain movements. Symptoms are improved by acetaminophen and heat. She denies weakness in the right leg, weakness in the left leg, tingling in the right leg, tingling in the left leg, burning pain in the right leg, burning pain in the left leg, urinary hesitancy, urinary incontinence, urinary retention, bowel incontinence, constipation, impotence and groin/perineal numbness associated with the back pain. Also wants her sugars checked today.    Review of Systems  Constitutional: Negative for chills, diaphoresis and fever.  Gastrointestinal: Negative for nausea and vomiting.  Genitourinary: Negative for hematuria.    Patient Active Problem List   Diagnosis Date Noted  . Hypothyroidism 05/23/2016  . Esophageal reflux 08/05/2015  . 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 Goiter 08/05/2015  . Hyperthyroidism 08/05/2015    Current Outpatient Medications on File Prior to Visit  Medication Sig Dispense Refill  . acetaminophen (TYLENOL) 325 MG tablet Take 650 mg by mouth every 6 (six) hours as needed.    Marland Kitchen CALCIUM PO Take 1 tablet by mouth daily.    . cetirizine (ZYRTEC) 10 MG tablet Take 1 tablet (10 mg total) by mouth daily. (Patient not taking: Reported on 10/05/2017) 90 tablet 1  . Cholecalciferol (VITAMIN D PO) Take 1 capsule by mouth daily.    . cyclobenzaprine (FLEXERIL) 5 MG tablet Take 1 tablet (5 mg total)  by mouth 3 (three) times daily as needed for muscle spasms. (Patient not taking: Reported on 10/05/2017) 60 tablet 0  . diclofenac sodium (VOLTAREN) 1 % GEL Apply 2 g topically 4 (four) times daily. (Patient not taking: Reported on 10/05/2017) 100 g 0  . fluticasone (FLONASE) 50 MCG/ACT nasal spray Place 2 sprays into both nostrils daily. (Patient not taking: Reported on 10/05/2017) 16 g 6  . levothyroxine (SYNTHROID, LEVOTHROID) 75 MCG tablet Take 1 tablet (75 mcg total) by mouth daily before breakfast. 60 tablet 0  . meloxicam (MOBIC) 7.5 MG tablet Take 1 tablet (7.5 mg total) by mouth daily. (Patient not taking: Reported on 10/05/2017) 30 tablet 0  . methocarbamol (ROBAXIN) 500 MG tablet Take 1 tablet (500 mg total) by mouth 2 (two) times daily. (Patient not taking: Reported on 10/05/2017) 20 tablet 0  . pantoprazole (PROTONIX) 40 MG tablet Take 1 tablet (40 mg total) by mouth daily. Take 30 min before the first meal of the day 30 tablet 1   No current facility-administered medications on file prior to visit.     No Known Allergies    Social History   Socioeconomic History  . Marital status: Married    Spouse name: Jessia Kief  . Number of children: 2  . Years of education: 9th grade  . Highest education level: Not on file  Occupational History  . Occupation: Stage manager  Social Needs  . Financial resource strain: Not on file  . Food insecurity:  Worry: Not on file    Inability: Not on file  . Transportation needs:    Medical: Not on file    Non-medical: Not on file  Tobacco Use  . Smoking status: Former Smoker    Last attempt to quit: 05/30/2013    Years since quitting: 4.4  . Smokeless tobacco: Never Used  . Tobacco comment: smoked 2-3 cigarettes a day for 5 years  Substance and Sexual Activity  . Alcohol use: No    Alcohol/week: 0.0 standard drinks  . Drug use: No  . Sexual activity: Yes    Partners: Male  Lifestyle  . Physical activity:    Days per week: Not on file     Minutes per session: Not on file  . Stress: Not on file  Relationships  . Social connections:    Talks on phone: Not on file    Gets together: Not on file    Attends religious service: Not on file    Active member of club or organization: Not on file    Attends meetings of clubs or organizations: Not on file    Relationship status: Not on file  . Intimate partner violence:    Fear of current or ex partner: Not on file    Emotionally abused: Not on file    Physically abused: Not on file    Forced sexual activity: Not on file  Other Topics Concern  . Not on file  Social History Narrative   From [place]. Came to the [place] 2000. Lives with her husband and their two daughters.    Objective:  There were no vitals taken for this visit.  Physical Exam  Constitutional: She is oriented to person, place, and time. She appears well-developed and well-nourished. No distress.  HENT:  Head: Normocephalic and atraumatic.  Eyes: Conjunctivae are normal.  Neck: Normal range of motion.  Pulmonary/Chest: Effort normal.  Musculoskeletal:       Thoracic back: Normal.       Lumbar back: She exhibits tenderness (mild reproducible TTP with palpation of b/l lumbar musculature). She exhibits normal range of motion, no bony tenderness and no spasm.  Neurological: She is alert and oriented to person, place, and time. Gait normal.  Reflex Scores:      Patellar reflexes are 2+ on the right side and 2+ on the left side.      Achilles reflexes are 2+ on the right side and 2+ on the left side. Neg SLR b/l Strength 5/5 b/l Sensation BLE intact  Skin: Skin is warm and dry.  Psychiatric: She has a normal mood and affect.  Vitals reviewed.   Assessment and Plan :  1. Chronic bilateral low back pain without sciatica Acute on chronic back pain. Hx and PE findings consistent with low back strain and spasm. No acute findings on neuro exam. No midline tenderness. Recommend conservative tx with rest, heating pad,  NSAIDs, muscle relaxants, and stretching as tolerated.  She declines PT, which has been recommended in the past. Rec using voltaren gel topically, muscle relaxants as needed.Cont w/ heating pad and start daily stretching. Given education material for low back stretches. Avoid heavy lifting until pain improves. Advised to return to clinic if symptoms worsen, do not improve, or as needed.  - methocarbamol (ROBAXIN) 500 MG tablet; Take 1 tablet (500 mg total) by mouth 2 (two) times daily.  Dispense: 20 tablet; Refill: 0  2. Screening for diabetes mellitus Labs pending.  - Hemoglobin A1c  Benjiman Core  PA-C  [facility] at [facility] 11/24/2017 12:17 PM

## 2017-11-25 LAB — HEMOGLOBIN A1C
ESTIMATED AVERAGE GLUCOSE: 117 mg/dL
Hgb A1c MFr Bld: 5.7 % — ABNORMAL HIGH (ref 4.8–5.6)

## 2018-10-25 ENCOUNTER — Other Ambulatory Visit: Payer: Self-pay

## 2018-10-25 ENCOUNTER — Ambulatory Visit: Payer: BLUE CROSS/BLUE SHIELD | Admitting: Family Medicine

## 2018-10-25 ENCOUNTER — Encounter: Payer: Self-pay | Admitting: Family Medicine

## 2018-10-25 VITALS — BP 127/80 | HR 98 | Temp 98.4°F | Wt 224.6 lb

## 2018-10-25 DIAGNOSIS — N760 Acute vaginitis: Secondary | ICD-10-CM | POA: Diagnosis not present

## 2018-10-25 DIAGNOSIS — M79671 Pain in right foot: Secondary | ICD-10-CM

## 2018-10-25 DIAGNOSIS — M79672 Pain in left foot: Secondary | ICD-10-CM

## 2018-10-25 DIAGNOSIS — R7303 Prediabetes: Secondary | ICD-10-CM | POA: Diagnosis not present

## 2018-10-25 DIAGNOSIS — R300 Dysuria: Secondary | ICD-10-CM

## 2018-10-25 DIAGNOSIS — B9689 Other specified bacterial agents as the cause of diseases classified elsewhere: Secondary | ICD-10-CM

## 2018-10-25 LAB — POCT URINALYSIS DIP (MANUAL ENTRY)
Bilirubin, UA: NEGATIVE
Blood, UA: NEGATIVE
Glucose, UA: NEGATIVE mg/dL
Ketones, POC UA: NEGATIVE mg/dL
Leukocytes, UA: NEGATIVE
Nitrite, UA: NEGATIVE
Protein Ur, POC: NEGATIVE mg/dL
Spec Grav, UA: 1.02 (ref 1.010–1.025)
Urobilinogen, UA: 0.2 E.U./dL
pH, UA: 6.5 (ref 5.0–8.0)

## 2018-10-25 LAB — POC MICROSCOPIC URINALYSIS (UMFC): Mucus: ABSENT

## 2018-10-25 LAB — POCT WET + KOH PREP
Trich by wet prep: ABSENT
Yeast by KOH: ABSENT
Yeast by wet prep: ABSENT

## 2018-10-25 MED ORDER — METRONIDAZOLE 500 MG PO TABS: ORAL_TABLET | ORAL | 0 refills | Status: AC

## 2018-10-25 NOTE — Progress Notes (Signed)
Subjective:    Patient ID: [name], female    DOB: [birth year], 48 y.o.   MRN: [id number]  HPI [name] is a 48 y.o. female Presents today for: Chief Complaint  Patient presents with  . Establish Care    having issues with low abd burning and sometime burn/itch after urination 2 weeks  . Foot Pain    bottom of left foot hurts 2 weeks  offered interpreter - declined - dtr present to translate.   New patient to me, initially to establish care but with acute issues as above to address today.  History of hypothyroidism, managed by endocrinology [facility] at [facility].  Vaginal itching/discomfort Past 2 weeks   Some burning at times with urination. Dry vaginal mucosa. Postmenopausal - no menses since 2011.  No new vaginal discharge. No itching.  Some urinary frequency.  No hematuria. No fever/n/v/back pain.  No abdominal or pelvic pain.  Tx: none.   History of prediabetes  Lab Results  Component Value Date   HGBA1C 5.7 (H) 11/24/2017   Wt Readings from Last 3 Encounters:  10/25/18 224 lb 9.6 oz (101.9 kg)  11/24/17 225 lb (102.1 kg)  10/05/17 222 lb 3.2 oz (100.8 kg)    bilaterral foot pain: Past 2 weeks.  Underside of foot.  No known injury.  No new footwear.  More pain at night with going to sleep.  Some tingling in feet at times.  Normal TSH 2.95 on 09/21/17.  Tx: none.    Patient Active Problem List   Diagnosis Date Noted  . Hypothyroidism 05/23/2016  . Esophageal reflux 08/05/2015  . 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 Goiter 08/05/2015  . Hyperthyroidism 08/05/2015   Past Medical History:  Diagnosis Date  . Allergy   . Thyroid disease    Past Surgical History:  Procedure Laterality Date  . DILATION AND CURETTAGE OF UTERUS     No Known Allergies Prior to Admission medications   Medication Sig Start Date End Date Taking? Authorizing Provider  acetaminophen (TYLENOL) 325 MG tablet Take 650 mg by mouth every 6 (six) hours as needed.    [provider]  Cholecalciferol (VITAMIN D PO) Take 1 capsule by mouth daily.    [provider]  levothyroxine (SYNTHROID, LEVOTHROID) 75 MCG tablet Take 1 tablet (75 mcg total) by mouth daily before breakfast. 10/23/16   Magdalene River, PA-C   Social History   Socioeconomic History  . Marital status: Married    Spouse name: Takeela Peil  . Number of children: 2  . Years of education: 9th grade  . Highest education level: Not on file  Occupational History  . Occupation: Stage manager  Social Needs  . Financial resource strain: Not on file  . Food insecurity    Worry: Not on file    Inability: Not on file  . Transportation needs    Medical: Not on file    Non-medical: Not on file  Tobacco Use  . Smoking status: Former Smoker    Quit date: 05/30/2013    Years since quitting: 5.4  . Smokeless tobacco: Never Used  . Tobacco comment: smoked 2-3 cigarettes a day for 5 years  Substance and Sexual Activity  . Alcohol use: No    Alcohol/week: 0.0 standard drinks  . Drug use: No  . Sexual activity: Yes    Partners: Male  Lifestyle  . Physical activity    Days per week: Not on file    Minutes per session:  Not on file  . Stress: Not on file  Relationships  . Social Musician on phone: Not on file    Gets together: Not on file    Attends religious service: Not on file    Active member of club or organization: Not on file    Attends meetings of clubs or organizations: Not on file    Relationship status: Not on file  . Intimate partner violence    Fear of current or ex partner: Not on file    Emotionally abused: Not on file    Physically abused: Not on file    Forced sexual activity: Not on file  Other Topics Concern  . Not on file  Social History Narrative   From [place]. Came to the [place] 2000. Lives with her husband and their two daughters.    Review of Systems Per HPI.     Objective:   Physical Exam Exam conducted with a chaperone present.   Constitutional:      General: She is not in acute distress.    Appearance: She is well-developed.  HENT:     Head: Normocephalic and atraumatic.  Cardiovascular:     Rate and Rhythm: Normal rate.  Pulmonary:     Effort: Pulmonary effort is normal.  Abdominal:     General: Abdomen is flat. Bowel sounds are normal.     Tenderness: There is no abdominal tenderness. There is no right CVA tenderness or left CVA tenderness.  Genitourinary:    Exam position: Lithotomy position.     Pubic Area: No rash.      Labia:        Right: No rash.        Left: No rash.      Vagina: Vaginal discharge (min white d/c. ) present. No erythema or bleeding.     Cervix: No cervical motion tenderness.     Uterus: Not tender.      Adnexa:        Right: No tenderness.         Left: No tenderness.    Musculoskeletal:     Right foot: Normal capillary refill. No tenderness, bony tenderness or swelling.     Left foot: Normal capillary refill. No tenderness, bony tenderness or swelling.     Comments: Bilateral foot exam: Nontender, no swelling.  Plantar fascia nontender.  Normal sensation distally with cap refill less than 1 second  Neurological:     Mental Status: She is alert and oriented to person, place, and time.   . Vitals:   10/25/18 1121  BP: 127/80  Pulse: 98  Temp: 98.4 F (36.9 C)  TempSrc: Oral  SpO2: 99%  Weight: 224 lb 9.6 oz (101.9 kg)    Results for orders placed or performed in visit on 10/25/18  POCT urinalysis dipstick  Result Value Ref Range   Color, UA yellow yellow   Clarity, UA clear clear   Glucose, UA negative negative mg/dL   Bilirubin, UA negative negative   Ketones, POC UA negative negative mg/dL   Spec Grav, UA 4.098 1.191 - 1.025   Blood, UA negative negative   pH, UA 6.5 5.0 - 8.0   Protein Ur, POC negative negative mg/dL   Urobilinogen, UA 0.2 0.2 or 1.0 E.U./dL   Nitrite, UA Negative Negative   Leukocytes, UA Negative Negative  POCT Wet + KOH Prep  Result  Value Ref Range   Yeast by KOH Absent Absent   Yeast  by wet prep Absent Absent   WBC by wet prep Few Few   Clue Cells Wet Prep HPF POC Few (A) None   Trich by wet prep Absent Absent   Bacteria Wet Prep HPF POC Many (A) Few   Epithelial Cells By [employer] Pref ([facility]) Moderate (A) None, Few, Too numerous to count   RBC,UR,HPF,POC None None RBC/hpf  POCT Microscopic Urinalysis ([facility])  Result Value Ref Range   WBC,UR,HPF,POC None None WBC/hpf   RBC,UR,HPF,POC None None RBC/hpf   Bacteria None None, Too numerous to count   Mucus Absent Absent   Epithelial Cells, UR Per Microscopy None None, Too numerous to count cells/hpf       Assessment & Plan:    [name] is a 48 y.o. female Burning with urination - Plan: POCT urinalysis dipstick, POCT Wet + KOH Prep, POCT Microscopic Urinalysis ([facility]), metroNIDAZOLE (FLAGYL) 500 MG tablet Bacterial vaginitis - Plan: metroNIDAZOLE (FLAGYL) 500 MG tablet  -Possible bacterial vaginosis.  Minimal findings on wet prep, but reassuring urinalysis.  Flagyl for 7 days, recheck if symptoms persist, sooner if worsening.  Differential includes atrophic vaginitis.   Prediabetes - Plan: Hemoglobin A1c, Basic metabolic panel  -Repeat testing.  No glycosuria, but check levels as urinary frequency could be related to hyperglycemia.  Bilateral foot pain  -Reassuring exam.  Over-the-counter shoe insert discussed as option temporarily and follow-up in the next few weeks.  Intermittent symptoms, but differential includes peripheral neuropathy.  Labs as above  Meds ordered this encounter  Medications  . metroNIDAZOLE (FLAGYL) 500 MG tablet    Sig: 1 pill by mouth twice per day.  Avoid any alcohol while taking this medicine.    Dispense:  14 tablet    Refill:  0   Patient Instructions   Try antibiotic for possible bacterial vaginosis.  1 pill twice per day.  I will check prediabetes and blood sugar test as those can sometimes cause frequent urination.  I do not see  signs of an infection in the urine today.  Foot pain can be due to multiple causes, but can try over-the-counter insert to see if that provides more cushioning throughout the day.  Recheck in the next few weeks to discuss further.    Foot Pain Many things can cause foot pain. Some common causes are:  An injury.  A sprain.  Arthritis.  Blisters.  Bunions. Follow these instructions at home: Managing pain, stiffness, and swelling If directed, put ice on the painful area:  Put ice in a plastic bag.  Place a towel between your skin and the bag.  Leave the ice on for 20 minutes, 2-3 times a day.  Activity  Do not stand or walk for long periods.  Return to your normal activities as told by your health care provider. Ask your health care provider what activities are safe for you.  Do stretches to relieve foot pain and stiffness as told by your health care provider.  Do not lift anything that is heavier than 10 lb (4.5 kg), or the limit that you are told, until your health care provider says that it is safe. Lifting a lot of weight can put added pressure on your feet. Lifestyle  Wear comfortable, supportive shoes that fit you well. Do not wear high heels.  Keep your feet clean and dry. General instructions  Take over-the-counter and prescription medicines only as told by your health care provider.  Rub your foot gently.  Pay attention to any changes in  your symptoms.  Keep all follow-up visits as told by your health care provider. This is important. Contact a health care provider if:  Your pain does not get better after a few days of self-care.  Your pain gets worse.  You cannot stand on your foot. Get help right away if:  Your foot is numb or tingling.  Your foot or toes are swollen.  Your foot or toes turn white or blue.  You have warmth and redness along your foot. Summary  Common causes of foot pain are injury, sprain, arthritis, blisters or bunions.   Ice, medicines, and comfortable shoes may help foot pain.  Contact your health care provider if your pain does not get better after a few days of self-care. This information is not intended to replace advice given to you by your health care provider. Make sure you discuss any questions you have with your health care provider. Document Released: 02/12/2015 Document Revised: 11/01/2017 Document Reviewed: 11/01/2017 Elsevier Patient Education  2020 Else[employer] Inc.   Bacterial Vaginosis  Bacterial vaginosis is a vaginal infection that occurs when the normal balance of bacteria in the vagina is disrupted. It results from an overgrowth of certain bacteria. This is the most common vaginal infection among women ages 75-44. Because bacterial vaginosis increases your risk for STIs (sexually transmitted infections), getting treated can help reduce your risk for chlamydia, gonorrhea, herpes, and HIV (human immunodeficiency virus). Treatment is also important for preventing complications in pregnant women, because this condition can cause an early (premature) delivery. What are the causes? This condition is caused by an increase in harmful bacteria that are normally present in small amounts in the vagina. However, the reason that the condition develops is not fully understood. What increases the risk? The following factors may make you more likely to develop this condition:  Having a new sexual partner or multiple sexual partners.  Having unprotected sex.  Douching.  Having an intrauterine device (IUD).  Smoking.  Drug and alcohol abuse.  Taking certain antibiotic medicines.  Being pregnant. You cannot get bacterial vaginosis from toilet seats, bedding, swimming pools, or contact with objects around you. What are the signs or symptoms? Symptoms of this condition include:  Grey or white vaginal discharge. The discharge can also be watery or foamy.  A fish-like odor with discharge, especially  after sexual intercourse or during menstruation.  Itching in and around the vagina.  Burning or pain with urination. Some women with bacterial vaginosis have no signs or symptoms. How is this diagnosed? This condition is diagnosed based on:  Your medical history.  A physical exam of the vagina.  Testing a sample of vaginal fluid under a microscope to look for a large amount of bad bacteria or abnormal cells. Your health care provider may use a cotton swab or a small wooden spatula to collect the sample. How is this treated? This condition is treated with antibiotics. These may be given as a pill, a vaginal cream, or a medicine that is put into the vagina (suppository). If the condition comes back after treatment, a second round of antibiotics may be needed. Follow these instructions at home: Medicines  Take over-the-counter and prescription medicines only as told by your health care provider.  Take or use your antibiotic as told by your health care provider. Do not stop taking or using the antibiotic even if you start to feel better. General instructions  If you have a female sexual partner, tell her that you have a  vaginal infection. She should see her health care provider and be treated if she has symptoms. If you have a female sexual partner, he does not need treatment.  During treatment: ? Avoid sexual activity until you finish treatment. ? Do not douche. ? Avoid alcohol as directed by your health care provider. ? Avoid breastfeeding as directed by your health care provider.  Drink enough water and fluids to keep your urine clear or pale yellow.  Keep the area around your vagina and rectum clean. ? Wash the area daily with warm water. ? Wipe yourself from front to back after using the toilet.  Keep all follow-up visits as told by your health care provider. This is important. How is this prevented?  Do not douche.  Wash the outside of your vagina with warm water only.   Use protection when having sex. This includes latex condoms and dental dams.  Limit how many sexual partners you have. To help prevent bacterial vaginosis, it is best to have sex with just one partner (monogamous).  Make sure you and your sexual partner are tested for STIs.  Wear cotton or cotton-lined underwear.  Avoid wearing tight pants and pantyhose, especially during summer.  Limit the amount of alcohol that you drink.  Do not use any products that contain nicotine or tobacco, such as cigarettes and e-cigarettes. If you need help quitting, ask your health care provider.  Do not use illegal drugs. Where to find more information  Centers for Disease Control and Prevention: AppraiserFraud.fi  American Sexual Health Association (ASHA): www.ashastd.org  U.S. Department of Health and Financial controller, Office on Women's Health: DustingSprays.pl or SecuritiesCard.it Contact a health care provider if:  Your symptoms do not improve, even after treatment.  You have more discharge or pain when urinating.  You have a fever.  You have pain in your abdomen.  You have pain during sex.  You have vaginal bleeding between periods. Summary  Bacterial vaginosis is a vaginal infection that occurs when the normal balance of bacteria in the vagina is disrupted.  Because bacterial vaginosis increases your risk for STIs (sexually transmitted infections), getting treated can help reduce your risk for chlamydia, gonorrhea, herpes, and HIV (human immunodeficiency virus). Treatment is also important for preventing complications in pregnant women, because the condition can cause an early (premature) delivery.  This condition is treated with antibiotic medicines. These may be given as a pill, a vaginal cream, or a medicine that is put into the vagina (suppository). This information is not intended to replace advice given to you by your health care provider.  Make sure you discuss any questions you have with your health care provider. Document Released: 01/16/2005 Document Revised: 12/29/2016 Document Reviewed: 10/02/2015 Elsevier Patient Education  [employer].    If you have lab work done today you will be contacted with your lab results within the next 2 weeks.  If you have not heard from [place] then please contact us. The fastest way to get your results is to register for My Chart.   IF you received an x-ray today, you will receive an invoice from [facility]. Please contact [facility] at [phone] with questions or concerns regarding your invoice.   IF you received labwork today, you will receive an invoice from [facility]. Please contact [facility] at [phone] with questions or concerns regarding your invoice.   Our billing staff will not be able to assist you with questions regarding bills from these companies.  You will be contacted  with the lab results as soon as they are available. The fastest way to get your results is to activate your My Chart account. Instructions are located on the last page of this paperwork. If you have not heard from us regarding the results in 2 weeks, please contact this office.

## 2018-10-25 NOTE — Patient Instructions (Addendum)
Try antibiotic for possible bacterial vaginosis.  1 pill twice per day.  I will check prediabetes and blood sugar test as those can sometimes cause frequent urination.  I do not see signs of an infection in the urine today.  Foot pain can be due to multiple causes, but can try over-the-counter insert to see if that provides more cushioning throughout the day.  Recheck in the next few weeks to discuss further.    Foot Pain Many things can cause foot pain. Some common causes are:  An injury.  A sprain.  Arthritis.  Blisters.  Bunions. Follow these instructions at home: Managing pain, stiffness, and swelling If directed, put ice on the painful area:  Put ice in a plastic bag.  Place a towel between your skin and the bag.  Leave the ice on for 20 minutes, 2-3 times a day.  Activity  Do not stand or walk for long periods.  Return to your normal activities as told by your health care provider. Ask your health care provider what activities are safe for you.  Do stretches to relieve foot pain and stiffness as told by your health care provider.  Do not lift anything that is heavier than 10 lb (4.5 kg), or the limit that you are told, until your health care provider says that it is safe. Lifting a lot of weight can put added pressure on your feet. Lifestyle  Wear comfortable, supportive shoes that fit you well. Do not wear high heels.  Keep your feet clean and dry. General instructions  Take over-the-counter and prescription medicines only as told by your health care provider.  Rub your foot gently.  Pay attention to any changes in your symptoms.  Keep all follow-up visits as told by your health care provider. This is important. Contact a health care provider if:  Your pain does not get better after a few days of self-care.  Your pain gets worse.  You cannot stand on your foot. Get help right away if:  Your foot is numb or tingling.  Your foot or toes are  swollen.  Your foot or toes turn white or blue.  You have warmth and redness along your foot. Summary  Common causes of foot pain are injury, sprain, arthritis, blisters or bunions.  Ice, medicines, and comfortable shoes may help foot pain.  Contact your health care provider if your pain does not get better after a few days of self-care. This information is not intended to replace advice given to you by your health care provider. Make sure you discuss any questions you have with your health care provider. Document Released: 02/12/2015 Document Revised: 11/01/2017 Document Reviewed: 11/01/2017 Elsevier Patient Education  2020 [employer]   Bacterial Vaginosis  Bacterial vaginosis is a vaginal infection that occurs when the normal balance of bacteria in the vagina is disrupted. It results from an overgrowth of certain bacteria. This is the most common vaginal infection among women ages 3915-44. Because bacterial vaginosis increases your risk for STIs (sexually transmitted infections), getting treated can help reduce your risk for chlamydia, gonorrhea, herpes, and HIV (human immunodeficiency virus). Treatment is also important for preventing complications in pregnant women, because this condition can cause an early (premature) delivery. What are the causes? This condition is caused by an increase in harmful bacteria that are normally present in small amounts in the vagina. However, the reason that the condition develops is not fully understood. What increases the risk? The following factors may make you  more likely to develop this condition:  Having a new sexual partner or multiple sexual partners.  Having unprotected sex.  Douching.  Having an intrauterine device (IUD).  Smoking.  Drug and alcohol abuse.  Taking certain antibiotic medicines.  Being pregnant. You cannot get bacterial vaginosis from toilet seats, bedding, swimming pools, or contact with objects around you. What  are the signs or symptoms? Symptoms of this condition include:  Grey or white vaginal discharge. The discharge can also be watery or foamy.  A fish-like odor with discharge, especially after sexual intercourse or during menstruation.  Itching in and around the vagina.  Burning or pain with urination. Some women with bacterial vaginosis have no signs or symptoms. How is this diagnosed? This condition is diagnosed based on:  Your medical history.  A physical exam of the vagina.  Testing a sample of vaginal fluid under a microscope to look for a large amount of bad bacteria or abnormal cells. Your health care provider may use a cotton swab or a small wooden spatula to collect the sample. How is this treated? This condition is treated with antibiotics. These may be given as a pill, a vaginal cream, or a medicine that is put into the vagina (suppository). If the condition comes back after treatment, a second round of antibiotics may be needed. Follow these instructions at home: Medicines  Take over-the-counter and prescription medicines only as told by your health care provider.  Take or use your antibiotic as told by your health care provider. Do not stop taking or using the antibiotic even if you start to feel better. General instructions  If you have a female sexual partner, tell her that you have a vaginal infection. She should see her health care provider and be treated if she has symptoms. If you have a female sexual partner, he does not need treatment.  During treatment: ? Avoid sexual activity until you finish treatment. ? Do not douche. ? Avoid alcohol as directed by your health care provider. ? Avoid breastfeeding as directed by your health care provider.  Drink enough water and fluids to keep your urine clear or pale yellow.  Keep the area around your vagina and rectum clean. ? Wash the area daily with warm water. ? Wipe yourself from front to back after using the  toilet.  Keep all follow-up visits as told by your health care provider. This is important. How is this prevented?  Do not douche.  Wash the outside of your vagina with warm water only.  Use protection when having sex. This includes latex condoms and dental dams.  Limit how many sexual partners you have. To help prevent bacterial vaginosis, it is best to have sex with just one partner (monogamous).  Make sure you and your sexual partner are tested for STIs.  Wear cotton or cotton-lined underwear.  Avoid wearing tight pants and pantyhose, especially during summer.  Limit the amount of alcohol that you drink.  Do not use any products that contain nicotine or tobacco, such as cigarettes and e-cigarettes. If you need help quitting, ask your health care provider.  Do not use illegal drugs. Where to find more information  Centers for Disease Control and Prevention: SolutionApps.co.za  American Sexual Health Association (ASHA): www.ashastd.org  [place] Department of Health and Health and safety inspector, Office on Women's Health: ConventionalMedicines.si or http://www.anderson-williamson.info/ Contact a health care provider if:  Your symptoms do not improve, even after treatment.  You have more discharge or pain when urinating.  You  have a fever.  You have pain in your abdomen.  You have pain during sex.  You have vaginal bleeding between periods. Summary  Bacterial vaginosis is a vaginal infection that occurs when the normal balance of bacteria in the vagina is disrupted.  Because bacterial vaginosis increases your risk for STIs (sexually transmitted infections), getting treated can help reduce your risk for chlamydia, gonorrhea, herpes, and HIV (human immunodeficiency virus). Treatment is also important for preventing complications in pregnant women, because the condition can cause an early (premature) delivery.  This condition is treated with antibiotic medicines.  These may be given as a pill, a vaginal cream, or a medicine that is put into the vagina (suppository). This information is not intended to replace advice given to you by your health care provider. Make sure you discuss any questions you have with your health care provider. Document Released: 01/16/2005 Document Revised: 12/29/2016 Document Reviewed: 10/02/2015 Elsevier Patient Education  [employer].    If you have lab work done today you will be contacted with your lab results within the next 2 weeks.  If you have not heard from [place] then please contact us. The fastest way to get your results is to register for My Chart.   IF you received an x-ray today, you will receive an invoice from [facility]. Please contact [facility] at [phone] with questions or concerns regarding your invoice.   IF you received labwork today, you will receive an invoice from [place]. Please contact [facility] at [phone] with questions or concerns regarding your invoice.   Our billing staff will not be able to assist you with questions regarding bills from these companies.  You will be contacted with the lab results as soon as they are available. The fastest way to get your results is to activate your My Chart account. Instructions are located on the last page of this paperwork. If you have not heard from [place] regarding the results in 2 weeks, please contact this office.

## 2018-10-26 LAB — BASIC METABOLIC PANEL
BUN/Creatinine Ratio: 17 (ref 9–23)
BUN: 11 mg/dL (ref 6–24)
CO2: 25 mmol/L (ref 20–29)
Calcium: 9 mg/dL (ref 8.7–10.2)
Chloride: 104 mmol/L (ref 96–106)
Creatinine, Ser: 0.65 mg/dL (ref 0.57–1.00)
GFR calc Af Amer: 122 mL/min/{1.73_m2} (ref >59)
GFR calc non Af Amer: 106 mL/min/{1.73_m2} (ref >59)
Glucose: 76 mg/dL (ref 65–99)
Potassium: 4.1 mmol/L (ref 3.5–5.2)
Sodium: 141 mmol/L (ref 134–144)

## 2018-10-26 LAB — HEMOGLOBIN A1C
Est. average glucose Bld gHb Est-mCnc: 120 mg/dL
Hgb A1c MFr Bld: 5.8 % — ABNORMAL HIGH (ref 4.8–5.6)

## 2018-10-31 ENCOUNTER — Encounter: Payer: Self-pay | Admitting: Radiology

## 2018-11-15 ENCOUNTER — Encounter: Payer: Self-pay | Admitting: Family Medicine

## 2018-11-15 ENCOUNTER — Other Ambulatory Visit: Payer: Self-pay

## 2018-11-15 ENCOUNTER — Ambulatory Visit: Payer: BLUE CROSS/BLUE SHIELD | Admitting: Family Medicine

## 2018-11-15 VITALS — BP 113/77 | HR 72 | Temp 98.4°F | Wt 223.5 lb

## 2018-11-15 DIAGNOSIS — L271 Localized skin eruption due to drugs and medicaments taken internally: Secondary | ICD-10-CM | POA: Diagnosis not present

## 2018-11-15 DIAGNOSIS — R208 Other disturbances of skin sensation: Secondary | ICD-10-CM

## 2018-11-15 NOTE — Patient Instructions (Addendum)
I will check for some deficiencies, but if that testing is normal can refer you to neurology.  Return to the clinic or go to the nearest emergency room if any of your symptoms worsen or new symptoms occur.   Peripheral Neuropathy Peripheral neuropathy is a type of nerve damage. It affects nerves that carry signals between the spinal cord and the arms, legs, and the rest of the body (peripheral nerves). It does not affect nerves in the spinal cord or brain. In peripheral neuropathy, one nerve or a group of nerves may be damaged. Peripheral neuropathy is a broad category that includes many specific nerve disorders, like diabetic neuropathy, hereditary neuropathy, and carpal tunnel syndrome. What are the causes? This condition may be caused by:  Diabetes. This is the most common cause of peripheral neuropathy.  Nerve injury.  Pressure or stress on a nerve that lasts a long time.  Lack (deficiency) of B vitamins. This can result from alcoholism, poor diet, or a restricted diet.  Infections.  Autoimmune diseases, such as rheumatoid arthritis and systemic lupus erythematosus.  Nerve diseases that are passed from parent to child (inherited).  Some medicines, such as cancer medicines (chemotherapy).  Poisonous (toxic) substances, such as lead and mercury.  Too little blood flowing to the legs.  Kidney disease.  Thyroid disease. In some cases, the cause of this condition is not known. What are the signs or symptoms? Symptoms of this condition depend on which of your nerves is damaged. Common symptoms include:  Loss of feeling (numbness) in the feet, hands, or both.  Tingling in the feet, hands, or both.  Burning pain.  Very sensitive skin.  Weakness.  Not being able to move a part of the body (paralysis).  Muscle twitching.  Clumsiness or poor coordination.  Loss of balance.  Not being able to control your bladder.  Feeling dizzy.  Sexual problems. How is this  diagnosed? Diagnosing and finding the cause of peripheral neuropathy can be difficult. Your health care provider will take your medical history and do a physical exam. A neurological exam will also be done. This involves checking things that are affected by your brain, spinal cord, and nerves (nervous system). For example, your health care provider will check your reflexes, how you move, and what you can feel. You may have other tests, such as:  Blood tests.  Electromyogram (EMG) and nerve conduction tests. These tests check nerve function and how well the nerves are controlling the muscles.  Imaging tests, such as CT scans or MRI to rule out other causes of your symptoms.  Removing a small piece of nerve to be examined in a lab (nerve biopsy). This is rare.  Removing and examining a small amount of the fluid that surrounds the brain and spinal cord (lumbar puncture). This is rare. How is this treated? Treatment for this condition may involve:  Treating the underlying cause of the neuropathy, such as diabetes, kidney disease, or vitamin deficiencies.  Stopping medicines that can cause neuropathy, such as chemotherapy.  Medicine to relieve pain. Medicines may include: ? Prescription or over-the-counter pain medicine. ? Antiseizure medicine. ? Antidepressants. ? Pain-relieving patches that are applied to painful areas of skin.  Surgery to relieve pressure on a nerve or to destroy a nerve that is causing pain.  Physical therapy to help improve movement and balance.  Devices to help you move around (assistive devices). Follow these instructions at home: Medicines  Take over-the-counter and prescription medicines only as told by  your health care provider. Do not take any other medicines without first asking your health care provider.  Do not drive or use heavy machinery while taking prescription pain medicine. Lifestyle   Do not use any products that contain nicotine or tobacco,  such as cigarettes and e-cigarettes. Smoking keeps blood from reaching damaged nerves. If you need help quitting, ask your health care provider.  Avoid or limit alcohol. Too much alcohol can cause a vitamin B deficiency, and vitamin B is needed for healthy nerves.  Eat a healthy diet. This includes: ? Eating foods that are high in fiber, such as fresh fruits and vegetables, whole grains, and beans. ? Limiting foods that are high in fat and processed sugars, such as fried or sweet foods. General instructions   If you have diabetes, work closely with your health care provider to keep your blood sugar under control.  If you have numbness in your feet: ? Check every day for signs of injury or infection. Watch for redness, warmth, and swelling. ? Wear padded socks and comfortable shoes. These help protect your feet.  Develop a good support system. Living with peripheral neuropathy can be stressful. Consider talking with a mental health specialist or joining a support group.  Use assistive devices and attend physical therapy as told by your health care provider. This may include using a walker or a cane.  Keep all follow-up visits as told by your health care provider. This is important. Contact a health care provider if:  You have new signs or symptoms of peripheral neuropathy.  You are struggling emotionally from dealing with peripheral neuropathy.  Your pain is not well-controlled. Get help right away if:  You have an injury or infection that is not healing normally.  You develop new weakness in an arm or leg.  You fall frequently. Summary  Peripheral neuropathy is when the nerves in the arms, or legs are damaged, resulting in numbness, weakness, or pain.  There are many causes of peripheral neuropathy, including diabetes, pinched nerves, vitamin deficiencies, autoimmune disease, and hereditary conditions.  Diagnosing and finding the cause of peripheral neuropathy can be  difficult. Your health care provider will take your medical history, do a physical exam, and do tests, including blood tests and nerve function tests.  Treatment involves treating the underlying cause of the neuropathy and taking medicines to help control pain. Physical therapy and assistive devices may also help. This information is not intended to replace advice given to you by your health care provider. Make sure you discuss any questions you have with your health care provider. Document Released: 01/06/2002 Document Revised: 12/29/2016 Document Reviewed: 03/27/2016 Elsevier Patient Education  [employer].    If you have lab work done today you will be contacted with your lab results within the next 2 weeks.  If you have not heard from [place] then please contact us. The fastest way to get your results is to register for My Chart.   IF you received an x-ray today, you will receive an invoice from [facility]. Please contact [facility] at [phone] with questions or concerns regarding your invoice.   IF you received labwork today, you will receive an invoice from [place]. Please contact [facility] at [phone] with questions or concerns regarding your invoice.   Our billing staff will not be able to assist you with questions regarding bills from these companies.  You will be contacted with the lab results as soon as they are available. The fastest  way to get your results is to activate your My Chart account. Instructions are located on the last page of this paperwork. If you have not heard from [place] regarding the results in 2 weeks, please contact this office.

## 2018-11-15 NOTE — Progress Notes (Signed)
Subjective:    Patient ID: [name], female    DOB: 07/02/1970, 48 y.o.   MRN: [id number]  HPI [name] [name] is a 48 y.o. female Presents today for: Chief Complaint  Patient presents with   Foot Pain     3 week f/u foot pain. Foot pain is doing much better but have not full gone away. Feet still be hot on the bottom every onve in a while  declined video translator, dtr translating when needed but understanding of most history in english.   Dysuria: Treated September 25, suspected bacterial vaginosis.  Treated with metronidazole 500 mg twice daily.  Possibility of atrophic vaginitis also discussed.  History of prediabetes, A1c stable, no glycosuria Lab Results  Component Value Date   HGBA1C 5.8 (H) 10/25/2018  dysuria resolved with Flagyl. No current sx's.    Bilateral foot pain: Reported 2 weeks of symptoms at September 25 visit on underside of foot.  No known injury or new footwear.  More symptoms at night with going to sleep along with some dysesthesias in the feet at times.  Normal TSH in 2019, BMP normal last visit, including glucose, creatinine.   Hypothyroidism managed by endocrinology with Differential included peripheral neuropathy, but intermittent symptoms.  Over-the-counter shoe insert discussed temporarily for arch support.  Plantar fascia was nontender.  Has been wearing new shoes. Better, still some plantar burning and notes some burning in palms at times as well. No rash. No hx of syphilis.  No mouth/oral lesions or symptoms.  Sx's present for about 1 month.  Taking MVI QD.     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 Authorizing Provider  acetaminophen (TYLENOL) 325 MG tablet Take 650 mg by mouth every 6 (six) hours as needed.   Yes [provider]  Cholecalciferol (VITAMIN D PO) Take 1 capsule by mouth daily.   Yes [provider]  levothyroxine (SYNTHROID, LEVOTHROID) 75 MCG tablet Take 1 tablet (75 mcg total) by mouth daily before breakfast. 10/23/16  Yes Barnett AbuWiseman, [place]any D, PA-C  metroNIDAZOLE (FLAGYL) 500 MG tablet 1 pill by mouth twice per day.  Avoid any alcohol while taking this medicine. 10/25/18  Yes Shade FloodGreene, Isaul Landi R, MD   Social History   Socioeconomic History   Marital status: Married    Spouse name: [name] Howell   Number of children: 2   Years of education: 9th grade   Highest education level: Not on file  Occupational History   Occupation: Housekeeping  Social Needs   Financial resource strain: Not on file   Food insecurity    Worry: Not on file    Inability: Not on file   Transportation needs    Medical: Not on file    Non-medical: Not on file  Tobacco Use   Smoking status: Former Smoker    Quit date: 05/30/2013    Years since quitting: 5.4   Smokeless tobacco: Never Used   Tobacco comment: smoked 2-3 cigarettes a day for 5 years  Substance and Sexual Activity   Alcohol use: No  Alcohol/week: 0.0 standard drinks   Drug use: No   Sexual activity: Yes    Partners: Male  Lifestyle   Physical activity    Days per week: Not on file    Minutes per session: Not on file   Stress: Not on file  Relationships   Social connections    Talks on phone: Not on file    Gets together: Not on file    Attends religious service: Not on file    Active member of club or organization: Not on file    Attends meetings of clubs or organizations: Not on file    Relationship status: Not on file   Intimate partner violence    Fear of current or ex partner: Not on file     Emotionally abused: Not on file    Physically abused: Not on file    Forced sexual activity: Not on file  Other Topics Concern   Not on file  Social History Narrative   From [place]. Came to the [place] 2000. Lives with her husband and their two daughters.    Review of Systems Per HPI.  No weakness.     Objective:   Physical Exam Vitals signs reviewed.  Constitutional:      General: She is not in acute distress.    Appearance: She is well-developed.  HENT:     Head: Normocephalic and atraumatic.  Cardiovascular:     Rate and Rhythm: Normal rate.  Pulmonary:     Effort: Pulmonary effort is normal.  Skin:    General: Skin is warm and dry.     Findings: No rash.     Comments: Sensation intact in hands, no swelling/rash.   Neurological:     Mental Status: She is alert and oriented to person, place, and time.    Diabetic Foot Exam - Simple   Simple Foot Form Diabetic Foot exam was performed with the following findings: Yes 11/15/2018 12:14 PM  Visual Inspection Sensation Testing Pulse Check Comments Decreased sensation site 789 on right foot, site 9 on left foot with some callus in the affected areas     Vitals:   11/15/18 1122  BP: 113/77  Pulse: 72  Temp: 98.4 F (36.9 C)  TempSrc: Oral  SpO2: 99%  Weight: 223 lb 8 oz (101.4 kg)          Assessment & Plan:   [name] is a 49 y.o. female Burning sensation of foot - Plan: TSH, RPR, Ferritin, Vitamin B12  Palmar plantar dysesthesia - Plan: TSH, RPR, Ferritin, Vitamin B12 Palmar plantar dysesthesias, suspected peripheral neuropathy, intermittent symptoms.  Less likely diabetic cause with prediabetes and normal glucose recently.  Thyroid has been managed by endocrinology.  -Check RPR, TSH, ferritin, B12 for deficiencies.  If these are normal, would consider neurology eval to decide on further work-up or testing.  RTC precautions if worse, otherwise 6-month follow-up for prediabetes.   No orders of the defined  types were placed in this encounter.  Patient Instructions    I will check for some deficiencies, but if that testing is normal can refer you to neurology.  Return to the clinic or go to the nearest emergency room if any of your symptoms worsen or new symptoms occur.   Peripheral Neuropathy Peripheral neuropathy is a type of nerve damage. It affects nerves that carry signals between the spinal cord and the arms, legs, and the rest of the body (peripheral nerves). It does not affect nerves in  the spinal cord or brain. In peripheral neuropathy, one nerve or a group of nerves may be damaged. Peripheral neuropathy is a broad category that includes many specific nerve disorders, like diabetic neuropathy, hereditary neuropathy, and carpal tunnel syndrome. What are the causes? This condition may be caused by:  Diabetes. This is the most common cause of peripheral neuropathy.  Nerve injury.  Pressure or stress on a nerve that lasts a long time.  Lack (deficiency) of B vitamins. This can result from alcoholism, poor diet, or a restricted diet.  Infections.  Autoimmune diseases, such as rheumatoid arthritis and systemic lupus erythematosus.  Nerve diseases that are passed from parent to child (inherited).  Some medicines, such as cancer medicines (chemotherapy).  Poisonous (toxic) substances, such as lead and mercury.  Too little blood flowing to the legs.  Kidney disease.  Thyroid disease. In some cases, the cause of this condition is not known. What are the signs or symptoms? Symptoms of this condition depend on which of your nerves is damaged. Common symptoms include:  Loss of feeling (numbness) in the feet, hands, or both.  Tingling in the feet, hands, or both.  Burning pain.  Very sensitive skin.  Weakness.  Not being able to move a part of the body (paralysis).  Muscle twitching.  Clumsiness or poor coordination.  Loss of balance.  Not being able to control your  bladder.  Feeling dizzy.  Sexual problems. How is this diagnosed? Diagnosing and finding the cause of peripheral neuropathy can be difficult. Your health care provider will take your medical history and do a physical exam. A neurological exam will also be done. This involves checking things that are affected by your brain, spinal cord, and nerves (nervous system). For example, your health care provider will check your reflexes, how you move, and what you can feel. You may have other tests, such as:  Blood tests.  Electromyogram (EMG) and nerve conduction tests. These tests check nerve function and how well the nerves are controlling the muscles.  Imaging tests, such as CT scans or MRI to rule out other causes of your symptoms.  Removing a small piece of nerve to be examined in a lab (nerve biopsy). This is rare.  Removing and examining a small amount of the fluid that surrounds the brain and spinal cord (lumbar puncture). This is rare. How is this treated? Treatment for this condition may involve:  Treating the underlying cause of the neuropathy, such as diabetes, kidney disease, or vitamin deficiencies.  Stopping medicines that can cause neuropathy, such as chemotherapy.  Medicine to relieve pain. Medicines may include: ? Prescription or over-the-counter pain medicine. ? Antiseizure medicine. ? Antidepressants. ? Pain-relieving patches that are applied to painful areas of skin.  Surgery to relieve pressure on a nerve or to destroy a nerve that is causing pain.  Physical therapy to help improve movement and balance.  Devices to help you move around (assistive devices). Follow these instructions at home: Medicines  Take over-the-counter and prescription medicines only as told by your health care provider. Do not take any other medicines without first asking your health care provider.  Do not drive or use heavy machinery while taking prescription pain  medicine. Lifestyle   Do not use any products that contain nicotine or tobacco, such as cigarettes and e-cigarettes. Smoking keeps blood from reaching damaged nerves. If you need help quitting, ask your health care provider.  Avoid or limit alcohol. Too much alcohol can cause a vitamin B  deficiency, and vitamin B is needed for healthy nerves.  Eat a healthy diet. This includes: ? Eating foods that are high in fiber, such as fresh fruits and vegetables, whole grains, and beans. ? Limiting foods that are high in fat and processed sugars, such as fried or sweet foods. General instructions   If you have diabetes, work closely with your health care provider to keep your blood sugar under control.  If you have numbness in your feet: ? Check every day for signs of injury or infection. Watch for redness, warmth, and swelling. ? Wear padded socks and comfortable shoes. These help protect your feet.  Develop a good support system. Living with peripheral neuropathy can be stressful. Consider talking with a mental health specialist or joining a support group.  Use assistive devices and attend physical therapy as told by your health care provider. This may include using a walker or a cane.  Keep all follow-up visits as told by your health care provider. This is important. Contact a health care provider if:  You have new signs or symptoms of peripheral neuropathy.  You are struggling emotionally from dealing with peripheral neuropathy.  Your pain is not well-controlled. Get help right away if:  You have an injury or infection that is not healing normally.  You develop new weakness in an arm or leg.  You fall frequently. Summary  Peripheral neuropathy is when the nerves in the arms, or legs are damaged, resulting in numbness, weakness, or pain.  There are many causes of peripheral neuropathy, including diabetes, pinched nerves, vitamin deficiencies, autoimmune disease, and hereditary  conditions.  Diagnosing and finding the cause of peripheral neuropathy can be difficult. Your health care provider will take your medical history, do a physical exam, and do tests, including blood tests and nerve function tests.  Treatment involves treating the underlying cause of the neuropathy and taking medicines to help control pain. Physical therapy and assistive devices may also help. This information is not intended to replace advice given to you by your health care provider. Make sure you discuss any questions you have with your health care provider. Document Released: 01/06/2002 Document Revised: 12/29/2016 Document Reviewed: 03/27/2016 Elsevier Patient Education  [employer].    If you have lab work done today you will be contacted with your lab results within the next 2 weeks.  If you have not heard from [place] then please contact us. The fastest way to get your results is to register for My Chart.   IF you received an x-ray today, you will receive an invoice from [facility]. Please contact [facility] at [phone] with questions or concerns regarding your invoice.   IF you received labwork today, you will receive an invoice from [facility]. Please contact [facility] at [phone] with questions or concerns regarding your invoice.   Our billing staff will not be able to assist you with questions regarding bills from these companies.  You will be contacted with the lab results as soon as they are available. The fastest way to get your results is to activate your My Chart account. Instructions are located on the last page of this paperwork. If you have not heard from [place] regarding the results in 2 weeks, please contact this office.       Signed,   Merri Ray, MD [facility] [place].  11/15/18 12:16 PM

## 2018-11-16 LAB — FERRITIN: Ferritin: 166 ng/mL — ABNORMAL HIGH (ref 15–150)

## 2018-11-16 LAB — RPR: RPR Ser Ql: NONREACTIVE

## 2018-11-16 LAB — VITAMIN B12: Vitamin B-12: 566 pg/mL (ref 232–1245)

## 2018-11-16 LAB — TSH: TSH: 2.58 u[IU]/mL (ref 0.450–4.500)

## 2019-03-14 ENCOUNTER — Ambulatory Visit (INDEPENDENT_AMBULATORY_CARE_PROVIDER_SITE_OTHER): Payer: BLUE CROSS/BLUE SHIELD

## 2019-03-14 ENCOUNTER — Encounter: Payer: Self-pay | Admitting: Registered Nurse

## 2019-03-14 ENCOUNTER — Other Ambulatory Visit: Payer: Self-pay

## 2019-03-14 ENCOUNTER — Ambulatory Visit: Payer: BLUE CROSS/BLUE SHIELD | Admitting: Registered Nurse

## 2019-03-14 VITALS — BP 138/85 | HR 78 | Temp 97.6°F | Wt 227.8 lb

## 2019-03-14 DIAGNOSIS — R7303 Prediabetes: Secondary | ICD-10-CM | POA: Diagnosis not present

## 2019-03-14 DIAGNOSIS — M25562 Pain in left knee: Secondary | ICD-10-CM | POA: Diagnosis not present

## 2019-03-14 MED ORDER — MELOXICAM 15 MG PO TABS: 15.0000 mg | ORAL_TABLET | Freq: Every day | ORAL | 0 refills | Status: AC

## 2019-03-14 MED ORDER — DICLOFENAC SODIUM 1 % EX GEL: 2.0000 g | Freq: Four times a day (QID) | CUTANEOUS | 0 refills | Status: AC

## 2019-03-14 NOTE — Patient Instructions (Signed)
° ° ° °  If you have lab work done today you will be contacted with your lab results within the next 2 weeks.  If you have not heard from us then please contact us. The fastest way to get your results is to register for My Chart. ° ° °IF you received an x-ray today, you will receive an invoice from [facility]. Please contact [facility] at [phone] with questions or concerns regarding your invoice.  ° °IF you received labwork today, you will receive an invoice from [facility]. Please contact [facility] at [phone] with questions or concerns regarding your invoice.  ° °Our billing staff will not be able to assist you with questions regarding bills from these companies. ° °You will be contacted with the lab results as soon as they are available. The fastest way to get your results is to activate your My Chart account. Instructions are located on the last page of this paperwork. If you have not heard from us regarding the results in 2 weeks, please contact this office. °  ° ° ° °

## 2019-03-14 NOTE — Progress Notes (Signed)
Acute Office Visit  Subjective:    Patient ID: [name], female    DOB: May 31, 1970, 49 y.o.   MRN: [id number]  Chief Complaint  Patient presents with  . Leg Pain    patient stated she has been have left leg pain for three weeks, states she just woke up and it was painful . per patient it hurts to walk , and is taking tylenol for pain.    HPI Patient is in today for pain in L knee. Started around 3 weeks ago. Overall worsening - worst in mornings and after sitting long periods. R knee has some pain but it is much more mild and started after L knee.  No swelling or local tenderness. Some radiation into thigh and calf, but no involvement of ankle or hip. No back pain. No numbness, burning, tingling in lower extremities beyond baseline - as she has had addressed with her PCP Dr. Neva Seat.   Past Medical History:  Diagnosis Date  . Allergy   . Thyroid disease     Past Surgical History:  Procedure Laterality Date  . DILATION AND CURETTAGE OF UTERUS      No family history on file.  Social History   Socioeconomic History  . Marital status: Married    Spouse name: Haely Leyland  . Number of children: 2  . Years of education: 9th grade  . Highest education level: Not on file  Occupational History  . Occupation: Housekeeping  Tobacco Use  . Smoking status: Former Smoker    Quit date: 05/30/2013    Years since quitting: 5.7  . Smokeless tobacco: Never Used  . Tobacco comment: smoked 2-3 cigarettes a day for 5 years  Substance and Sexual Activity  . Alcohol use: No    Alcohol/week: 0.0 standard drinks  . Drug use: No  . Sexual activity: Yes    Partners: Male  Other Topics Concern  . Not on file  Social History Narrative   From [place]. Came to the [place] 2000. Lives with her husband and their two daughters.   Social Determinants of Health   Financial Resource Strain:   . Difficulty of Paying Living Expenses: Not on file  Food Insecurity:   . Worried About Programme researcher, broadcasting/film/video  in the Last Year: Not on file  . Ran Out of Food in the Last Year: Not on file  Transportation Needs:   . Lack of Transportation (Medical): Not on file  . Lack of Transportation (Non-Medical): Not on file  Physical Activity:   . Days of Exercise per Week: Not on file  . Minutes of Exercise per Session: Not on file  Stress:   . Feeling of Stress : Not on file  Social Connections:   . Frequency of Communication with Friends and Family: Not on file  . Frequency of Social Gatherings with Friends and Family: Not on file  . Attends Religious Services: Not on file  . Active Member of Clubs or Organizations: Not on file  . Attends Banker Meetings: Not on file  . Marital Status: Not on file  Intimate Partner Violence:   . Fear of Current or Ex-Partner: Not on file  . Emotionally Abused: Not on file  . Physically Abused: Not on file  . Sexually Abused: Not on file    Outpatient Medications Prior to Visit  Medication Sig Dispense Refill  . acetaminophen (TYLENOL) 325 MG tablet Take 650 mg by mouth every 6 (six) hours as needed.    Marland Kitchen  Cholecalciferol (VITAMIN D PO) Take 1 capsule by mouth daily.    Marland Kitchen levothyroxine (SYNTHROID, LEVOTHROID) 75 MCG tablet Take 1 tablet (75 mcg total) by mouth daily before breakfast. 60 tablet 0  . metroNIDAZOLE (FLAGYL) 500 MG tablet 1 pill by mouth twice per day.  Avoid any alcohol while taking this medicine. (Patient not taking: Reported on 03/14/2019) 14 tablet 0   No facility-administered medications prior to visit.    No Known Allergies  Review of Systems  Constitutional: Negative.   HENT: Negative.   Eyes: Negative.   Respiratory: Negative.   Cardiovascular: Negative.   Gastrointestinal: Negative.   Endocrine: Negative.   Genitourinary: Negative.   Musculoskeletal: Positive for gait problem. Negative for arthralgias, back pain, joint swelling, myalgias, neck pain and neck stiffness.  Skin: Negative.   Allergic/Immunologic: Negative.     Hematological: Negative.   Psychiatric/Behavioral: Negative.   All other systems reviewed and are negative.      Objective:    Physical Exam Vitals and nursing note reviewed.  Constitutional:      General: She is not in acute distress.    Appearance: Normal appearance. She is normal weight. She is not ill-appearing, toxic-appearing or diaphoretic.  Cardiovascular:     Rate and Rhythm: Normal rate and regular rhythm.  Pulmonary:     Effort: Pulmonary effort is normal.  Musculoskeletal:        General: No swelling, tenderness, deformity or signs of injury.     Right lower leg: No edema.     Left lower leg: No edema.     Comments: Limited flexion of L knee dt pain  Skin:    Capillary Refill: Capillary refill takes less than 2 seconds.  Neurological:     General: No focal deficit present.     Mental Status: She is alert and oriented to person, place, and time. Mental status is at baseline.  Psychiatric:        Mood and Affect: Mood normal.        Behavior: Behavior normal.        Thought Content: Thought content normal.        Judgment: Judgment normal.     BP 138/85   Pulse 78   Temp 97.6 F (36.4 C) (Temporal)   Wt 227 lb 12.8 oz (103.3 kg)   SpO2 99%   BMI 37.33 kg/m  Wt Readings from Last 3 Encounters:  03/14/19 227 lb 12.8 oz (103.3 kg)  11/15/18 223 lb 8 oz (101.4 kg)  10/25/18 224 lb 9.6 oz (101.9 kg)    There are no preventive care reminders to display for this patient.  There are no preventive care reminders to display for this patient.   Lab Results  Component Value Date   TSH 2.580 11/15/2018   Lab Results  Component Value Date   WBC 6.6 05/30/2016   HGB 12.6 05/30/2016   HCT 38.9 05/30/2016   MCV 79 05/30/2016   PLT 239 05/30/2016   Lab Results  Component Value Date   NA 141 10/25/2018   K 4.1 10/25/2018   CO2 25 10/25/2018   GLUCOSE 76 10/25/2018   BUN 11 10/25/2018   CREATININE 0.65 10/25/2018   BILITOT 0.3 10/20/2016   ALKPHOS 71  10/20/2016   AST 23 10/20/2016   ALT 15 10/20/2016   PROT 7.7 10/20/2016   ALBUMIN 4.5 10/20/2016   CALCIUM 9.0 10/25/2018   Lab Results  Component Value Date   CHOL 173 05/30/2016  Lab Results  Component Value Date   HDL 46 05/30/2016   Lab Results  Component Value Date   LDLCALC 90 05/30/2016   Lab Results  Component Value Date   TRIG 183 (H) 05/30/2016   Lab Results  Component Value Date   CHOLHDL 3.8 05/30/2016   Lab Results  Component Value Date   HGBA1C 5.8 (H) 10/25/2018       Assessment & Plan:   Problem List Items Addressed This Visit    None    Visit Diagnoses    Acute pain of left knee    -  Primary   Relevant Medications   meloxicam (MOBIC) 15 MG tablet   diclofenac Sodium (VOLTAREN) 1 % GEL   Other Relevant Orders   DG Knee Complete 4 Views Left (Completed)   Vitamin D, 25-hydroxy   Prediabetes       Relevant Orders   Hemoglobin A1c       Meds ordered this encounter  Medications  . meloxicam (MOBIC) 15 MG tablet    Sig: Take 1 tablet (15 mg total) by mouth daily.    Dispense:  30 tablet    Refill:  0    Order Specific Question:   Supervising Provider    Answer:   Collie Siad A [id number]  . diclofenac Sodium (VOLTAREN) 1 % GEL    Sig: Apply 2 g topically 4 (four) times daily.    Dispense:  150 g    Refill:  0    Order Specific Question:   Supervising Provider    Answer:   Collie Siad A [id number]   PLAN  Xray shows mild degenerative changes in L knee. Appears as early OA  Will treat with anti-inflammatories. Should pain continue, can consider PT or ortho referrals  Patient encouraged to call clinic with any questions, comments, or concerns.   Janeece Agee, NP

## 2019-03-15 LAB — HEMOGLOBIN A1C
Est. average glucose Bld gHb Est-mCnc: 126 mg/dL
Hgb A1c MFr Bld: 6 % — ABNORMAL HIGH (ref 4.8–5.6)

## 2019-03-15 LAB — VITAMIN D 25 HYDROXY (VIT D DEFICIENCY, FRACTURES): Vit D, 25-Hydroxy: 19.4 ng/mL — ABNORMAL LOW (ref 30.0–100.0)

## 2019-03-19 NOTE — Progress Notes (Signed)
Good evening,  Ms. [name] needs to start an OTC vitamin D supplement.  In addition, she should keep an eye on her diet - her A1c is a little bit high. She should limit processed foods, sugary beverages, and high glycemic index foods.  These symptoms that she's having likely aren't related to her labs. Should they worsen or fail to improve, she should let me know. Additionally, I know she has a visit with Dr. Neva Seat in March  Thank you,  Jari Sportsman, NP

## 2019-04-14 ENCOUNTER — Ambulatory Visit: Payer: BLUE CROSS/BLUE SHIELD | Admitting: Family Medicine

## 2019-04-15 ENCOUNTER — Ambulatory Visit: Payer: BLUE CROSS/BLUE SHIELD | Admitting: Family Medicine

## 2019-04-18 ENCOUNTER — Other Ambulatory Visit: Payer: Self-pay

## 2019-04-18 ENCOUNTER — Ambulatory Visit (INDEPENDENT_AMBULATORY_CARE_PROVIDER_SITE_OTHER): Payer: BLUE CROSS/BLUE SHIELD | Admitting: Family Medicine

## 2019-04-18 ENCOUNTER — Encounter: Payer: Self-pay | Admitting: Family Medicine

## 2019-04-18 VITALS — BP 149/91 | HR 82 | Temp 97.8°F | Resp 16 | Ht 65.0 in | Wt 225.4 lb

## 2019-04-18 DIAGNOSIS — E669 Obesity, unspecified: Secondary | ICD-10-CM | POA: Diagnosis not present

## 2019-04-18 DIAGNOSIS — Z13220 Encounter for screening for lipoid disorders: Secondary | ICD-10-CM

## 2019-04-18 DIAGNOSIS — Z6837 Body mass index (BMI) 37.0-37.9, adult: Secondary | ICD-10-CM

## 2019-04-18 DIAGNOSIS — R7303 Prediabetes: Secondary | ICD-10-CM

## 2019-04-18 DIAGNOSIS — E559 Vitamin D deficiency, unspecified: Secondary | ICD-10-CM | POA: Diagnosis not present

## 2019-04-18 NOTE — Progress Notes (Signed)
Subjective:  Patient ID: Rachel [name], female    DOB: 12/28/1970  Age: 49 y.o. MRN: [id number]  CC:  Chief Complaint  Patient presents with  . Prediabetes    pt reports working on her diet and walking including up and down the stairs to help.     HPI Rachel [name] presents for   Prediabetes, obesity:  Body mass index is 37.51 kg/m.  Has been watching diet, eating healthy food.  Works in Stage managerhousekeeping. No other exercise. Tired at end of workday. Some chores at home.  Prior hand/foot dysesthesias resolved.  l [name] pain from last visit better.   Taking MVI, no change in thirst, urination, vision.   Wt Readings from Last 3 Encounters:  04/18/19 225 lb 6.4 oz (102.2 kg)  03/14/19 227 lb 12.8 oz (103.3 kg)  11/15/18 223 lb 8 oz (101.4 kg)   Lab Results  Component Value Date   CHOL 173 05/30/2016   HDL 46 05/30/2016   LDLCALC 90 05/30/2016   TRIG 183 (H) 05/30/2016   CHOLHDL 3.8 05/30/2016    Lab Results  Component Value Date   HGBA1C 6.0 (H) 03/14/2019   Low vitamin D. Level of 19 on 2/12. MVi once per day - 1000units.   History Patient Active Problem List   Diagnosis Date Noted  . Hypothyroidism 05/23/2016  . Esophageal reflux 08/05/2015  . 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 Goiter 08/05/2015  . Hyperthyroidism 08/05/2015   Past Medical History:  Diagnosis Date  . Allergy   . Thyroid disease    Past Surgical History:  Procedure Laterality Date  . DILATION AND CURETTAGE OF UTERUS     No Known Allergies Prior to Admission medications   Medication Sig Start Date End Date Taking? Authorizing Provider  acetaminophen (TYLENOL) 325 MG tablet Take 650 mg by mouth every 6 (six) hours as needed.   Yes [provider]  Cholecalciferol (VITAMIN D PO) Take 1 capsule by mouth daily.   Yes [provider]  diclofenac Sodium (VOLTAREN) 1 % GEL Apply 2 g topically 4 (four) times daily. 03/14/19  Yes Janeece AgeeMorrow, Richard, NP  levothyroxine (SYNTHROID, LEVOTHROID) 75  MCG tablet Take 1 tablet (75 mcg total) by mouth daily before breakfast. 10/23/16  Yes Barnett AbuWiseman, [place]ittany D, PA-C  meloxicam (MOBIC) 15 MG tablet Take 1 tablet (15 mg total) by mouth daily. 03/14/19  Yes Janeece AgeeMorrow, Richard, NP  metroNIDAZOLE (FLAGYL) 500 MG tablet 1 pill by mouth twice per day.  Avoid any alcohol while taking this medicine. 10/25/18  Yes Shade FloodGreene, Henreitta Spittler R, MD   Social History   Socioeconomic History  . Marital status: Married    Spouse name: Amedeo Goryli Hussein  . Number of children: 2  . Years of education: 9th grade  . Highest education level: Not on file  Occupational History  . Occupation: Housekeeping  Tobacco Use  . Smoking status: Former Smoker    Quit date: 05/30/2013    Years since quitting: 5.8  . Smokeless tobacco: Never Used  . Tobacco comment: smoked 2-3 cigarettes a day for 5 years  Substance and Sexual Activity  . Alcohol use: No    Alcohol/week: 0.0 standard drinks  . Drug use: No  . Sexual activity: Yes    Partners: Male  Other Topics Concern  . Not on file  Social History Narrative   From [place]. Came to the [place] 2000. Lives with her husband and their two daughters.   Social Determinants of Health   Financial Resource Strain:   .  Difficulty of Paying Living Expenses:   Food Insecurity:   . Worried About Programme researcher, broadcasting/film/video in the Last Year:   . Barista in the Last Year:   Transportation Needs:   . Freight forwarder (Medical):   Marland Kitchen Lack of Transportation (Non-Medical):   Physical Activity:   . Days of Exercise per Week:   . Minutes of Exercise per Session:   Stress:   . Feeling of Stress :   Social Connections:   . Frequency of Communication with Friends and Family:   . Frequency of Social Gatherings with Friends and Family:   . Attends Religious Services:   . Active Member of Clubs or Organizations:   . Attends Banker Meetings:   Marland Kitchen Marital Status:   Intimate Partner Violence:   . Fear of Current or Ex-Partner:   .  Emotionally Abused:   Marland Kitchen Physically Abused:   . Sexually Abused:     Review of Systems   Objective:   Vitals:   04/18/19 1506  BP: (!) 149/91  Pulse: 82  Resp: 16  Temp: 97.8 F (36.6 C)  TempSrc: Temporal  SpO2: 98%  Weight: 225 lb 6.4 oz (102.2 kg)  Height:  (1.651 m)     Physical Exam Vitals reviewed.  Constitutional:      Appearance: She is well-developed. She is obese.  HENT:     Head: Normocephalic and atraumatic.  Eyes:     Conjunctiva/sclera: Conjunctivae normal.     Pupils: Pupils are equal, round, and reactive to light.  Neck:     Vascular: No carotid bruit.  Cardiovascular:     Rate and Rhythm: Normal rate and regular rhythm.     Heart sounds: Normal heart sounds.  Pulmonary:     Effort: Pulmonary effort is normal.     Breath sounds: Normal breath sounds.  Abdominal:     Palpations: Abdomen is soft. There is no pulsatile mass.     Tenderness: There is no abdominal tenderness.  Skin:    General: Skin is warm and dry.  Neurological:     Mental Status: She is alert and oriented to person, place, and time.  Psychiatric:        Behavior: Behavior normal.      Assessment & Plan:  Rachel [name] is a 49 y.o. female . Prediabetes - Plan: Comprehensive metabolic panel Class 2 obesity without serious comorbidity with body mass index (BMI) of 37.0 to 37.9 in adult, unspecified obesity type  -recent A1c discussed. Exercise, diet for weight loss. Prediabetes diet and handout given. Recheck levels in 5months.   Vitamin D deficiency  - add additional 1000iu vit d otc QD.   Screening for hyperlipidemia - Plan: Comprehensive metabolic panel, Lipid panel  rtc precautions if [name] pain not continuing to improve.   No orders of the defined types were placed in this encounter.  Patient Instructions    Your vitamin D was low last month.  Your multivitamin has some of that supplement, but please take additional 1000 units of vitamin D over-the-counter once  per day.  Recheck in 5 months to repeat prediabetes test.  I will check cholesterol levels today.  If [name] pain does not continue to improve please follow-up for separate visit.  Thank you for coming in today  Prediabetes Prediabetes is the condition of having a blood sugar (blood glucose) level that is higher than it should be, but not high enough for you to be  diagnosed with type 2 diabetes. Having prediabetes puts you at risk for developing type 2 diabetes (type 2 diabetes mellitus). Prediabetes may be called impaired glucose tolerance or impaired fasting glucose. Prediabetes usually does not cause symptoms. Your health care provider can diagnose this condition with blood tests. You may be tested for prediabetes if you are overweight and if you have at least one other risk factor for prediabetes. What is blood glucose, and how is it measured? Blood glucose refers to the amount of glucose in your bloodstream. Glucose comes from eating foods that contain sugars and starches (carbohydrates), which the body breaks down into glucose. Your blood glucose level may be measured in mg/dL (milligrams per deciliter) or mmol/L (millimoles per liter). Your blood glucose may be checked with one or more of the following blood tests:  A fasting blood glucose (FBG) test. You will not be allowed to eat (you will fast) for 8 hours or longer before a blood sample is taken. ? A normal range for FBG is 70-100 mg/dl (8.1-0.1 mmol/L).  An A1c (hemoglobin A1c) blood test. This test provides information about blood glucose control over the previous 2?54months.  An oral glucose tolerance test (OGTT). This test measures your blood glucose at two times: ? After fasting. This is your baseline level. ? Two hours after you drink a beverage that contains glucose. You may be diagnosed with prediabetes:  If your FBG is 100?125 mg/dL (7.5-1.0 mmol/L).  If your A1c level is 5.7?6.4%.  If your OGTT result is 140?199 mg/dL (2.5-85  mmol/L). These blood tests may be repeated to confirm your diagnosis. How can this condition affect me? The pancreas produces a hormone (insulin) that helps to move glucose from the bloodstream into cells. When cells in the body do not respond properly to insulin that the body makes (insulin resistance), excess glucose builds up in the blood instead of going into cells. As a result, high blood glucose (hyperglycemia) can develop, which can cause many complications. Hyperglycemia is a symptom of prediabetes. Having high blood glucose for a long time is dangerous. Too much glucose in your blood can damage your nerves and blood vessels. Long-term damage can lead to complications from diabetes, which may include:  Heart disease.  Stroke.  Blindness.  Kidney disease.  Depression.  Poor circulation in the feet and legs, which could lead to surgical removal (amputation) in severe cases. What can increase my risk? Risk factors for prediabetes include:  Having a family member with type 2 diabetes.  Being overweight or obese.  Being older than age 16.  Being of American [place], African-American, Hispanic/Latino, or Asian/Pacific Islander descent.  Having an inactive (sedentary) lifestyle.  Having a history of heart disease.  History of gestational diabetes or polycystic ovary syndrome (PCOS), in women.  Having low levels of good cholesterol (HDL-C) or high levels of blood fats (triglycerides).  Having high blood pressure. What actions can I take to prevent diabetes?      Be physically active. ? Do moderate-intensity physical activity for 30 or more minutes on 5 or more days of the week, or as much as told by your health care provider. This could be brisk walking, biking, or water aerobics. ? Ask your health care provider what activities are safe for you. A mix of physical activities may be best, such as walking, swimming, cycling, and strength training.  Lose weight as told by  your health care provider. ? Losing 5-7% of your body weight can reverse insulin resistance. ?  Your health care provider can determine how much weight loss is best for you and can help you lose weight safely.  Follow a healthy meal plan. This includes eating lean proteins, complex carbohydrates, fresh fruits and vegetables, low-fat dairy products, and healthy fats. ? Follow instructions from your health care provider about eating or drinking restrictions. ? Make an appointment to see a diet and nutrition specialist (registered dietitian) to help you create a healthy eating plan that is right for you.  Do not smoke or use any tobacco products, such as cigarettes, chewing tobacco, and e-cigarettes. If you need help quitting, ask your health care provider.  Take over-the-counter and prescription medicines as told by your health care provider. You may be prescribed medicines that help lower the risk of type 2 diabetes.  Keep all follow-up visits as told by your health care provider. This is important. Summary  Prediabetes is the condition of having a blood sugar (blood glucose) level that is higher than it should be, but not high enough for you to be diagnosed with type 2 diabetes.  Having prediabetes puts you at risk for developing type 2 diabetes (type 2 diabetes mellitus).  To help prevent type 2 diabetes, make lifestyle changes such as being physically active and eating a healthy diet. Lose weight as told by your health care provider. This information is not intended to replace advice given to you by your health care provider. Make sure you discuss any questions you have with your health care provider. Document Revised: 05/10/2018 Document Reviewed: 03/09/2015 Elsevier Patient Education  2020 Elsevier Inc.  Prediabetes Eating Plan Prediabetes is a condition that causes blood sugar (glucose) levels to be higher than normal. This increases the risk for developing diabetes. In order to prevent  diabetes from developing, your health care provider may recommend a diet and other lifestyle changes to help you:  Control your blood glucose levels.  Improve your cholesterol levels.  Manage your blood pressure. Your health care provider may recommend working with a diet and nutrition specialist (dietitian) to make a meal plan that is best for you. What are tips for following this plan? Lifestyle  Set weight loss goals with the help of your health care team. It is recommended that most people with prediabetes lose 7% of their current body weight.  Exercise for at least 30 minutes at least 5 days a week.  Attend a support group or seek ongoing support from a mental health counselor.  Take over-the-counter and prescription medicines only as told by your health care provider. Reading food labels  Read food labels to check the amount of fat, salt (sodium), and sugar in prepackaged foods. Avoid foods that have: ? Saturated fats. ? Trans fats. ? Added sugars.  Avoid foods that have more than 300 milligrams (mg) of sodium per serving. Limit your daily sodium intake to less than 2,300 mg each day. Shopping  Avoid buying pre-made and processed foods. Cooking  Cook with olive oil. Do not use butter, lard, or ghee.  Bake, broil, grill, or boil foods. Avoid frying. Meal planning   Work with your dietitian to develop an eating plan that is right for you. This may include: ? Tracking how many calories you take in. Use a food diary, notebook, or mobile application to track what you eat at each meal. ? Using the glycemic index (GI) to plan your meals. The index tells you how quickly a food will raise your blood glucose. Choose low-GI foods. These foods  take a longer time to raise blood glucose.  Consider following a Mediterranean diet. This diet includes: ? Several servings each day of fresh fruits and vegetables. ? Eating fish at least twice a week. ? Several servings each day of whole  grains, beans, nuts, and seeds. ? Using olive oil instead of other fats. ? Moderate alcohol consumption. ? Eating small amounts of red meat and whole-fat dairy.  If you have high blood pressure, you may need to limit your sodium intake or follow a diet such as the DASH eating plan. DASH is an eating plan that aims to lower high blood pressure. What foods are recommended? The items listed below may not be a complete list. Talk with your dietitian about what dietary choices are best for you. Grains Whole grains, such as whole-wheat or whole-grain breads, crackers, cereals, and pasta. Unsweetened oatmeal. Bulgur. Barley. Quinoa. Brown rice. Corn or whole-wheat flour tortillas or taco shells. Vegetables Lettuce. Spinach. Peas. Beets. Cauliflower. Cabbage. Broccoli. Carrots. Tomatoes. Squash. Eggplant. Herbs. Peppers. Onions. Cucumbers. Brussels sprouts. Fruits Berries. Bananas. Apples. Oranges. Grapes. Papaya. Mango. Pomegranate. Kiwi. Grapefruit. Cherries. Meats and other protein foods Seafood. Poultry without skin. Lean cuts of pork and beef. Tofu. Eggs. Nuts. Beans. Dairy Low-fat or fat-free dairy products, such as yogurt, cottage cheese, and cheese. Beverages Water. Tea. Coffee. Sugar-free or diet soda. Seltzer water. Lowfat or no-fat milk. Milk alternatives, such as soy or almond milk. Fats and oils Olive oil. Canola oil. Sunflower oil. Grapeseed oil. Avocado. Walnuts. Sweets and desserts Sugar-free or low-fat pudding. Sugar-free or low-fat ice cream and other frozen treats. Seasoning and other foods Herbs. Sodium-free spices. Mustard. Relish. Low-fat, low-sugar ketchup. Low-fat, low-sugar barbecue sauce. Low-fat or fat-free mayonnaise. What foods are not recommended? The items listed below may not be a complete list. Talk with your dietitian about what dietary choices are best for you. Grains Refined white flour and flour products, such as bread, pasta, snack foods, and  cereals. Vegetables Canned vegetables. Frozen vegetables with butter or cream sauce. Fruits Fruits canned with syrup. Meats and other protein foods Fatty cuts of meat. Poultry with skin. Breaded or fried meat. Processed meats. Dairy Full-fat yogurt, cheese, or milk. Beverages Sweetened drinks, such as sweet iced tea and soda. Fats and oils Butter. Lard. Ghee. Sweets and desserts Baked goods, such as cake, cupcakes, pastries, cookies, and cheesecake. Seasoning and other foods Spice mixes with added salt. Ketchup. Barbecue sauce. Mayonnaise. Summary  To prevent diabetes from developing, you may need to make diet and other lifestyle changes to help control blood sugar, improve cholesterol levels, and manage your blood pressure.  Set weight loss goals with the help of your health care team. It is recommended that most people with prediabetes lose 7 percent of their current body weight.  Consider following a Mediterranean diet that includes plenty of fresh fruits and vegetables, whole grains, beans, nuts, seeds, fish, lean meat, low-fat dairy, and healthy oils. This information is not intended to replace advice given to you by your health care provider. Make sure you discuss any questions you have with your health care provider. Document Revised: 05/10/2018 Document Reviewed: 03/22/2016 Elsevier Patient Education  [employer].   If you have lab work done today you will be contacted with your lab results within the next 2 weeks.  If you have not heard from [place] then please contact us. The fastest way to get your results is to register for My Chart.   IF you received an x-ray  today, you will receive an invoice from [facility]. Please contact [facility] at [phone] with questions or concerns regarding your invoice.   IF you received labwork today, you will receive an invoice from [facility]. Please contact [facility] at [phone] with questions or concerns  regarding your invoice.   Our billing staff will not be able to assist you with questions regarding bills from these companies.  You will be contacted with the lab results as soon as they are available. The fastest way to get your results is to activate your My Chart account. Instructions are located on the last page of this paperwork. If you have not heard from [place] regarding the results in 2 weeks, please contact this office.         Signed, Meredith Staggers, MD [facility]

## 2019-04-18 NOTE — Patient Instructions (Addendum)
Your vitamin D was low last month.  Your multivitamin has some of that supplement, but please take additional 1000 units of vitamin D over-the-counter once per day.  Recheck in 5 months to repeat prediabetes test.  I will check cholesterol levels today.  If knee pain does not continue to improve please follow-up for separate visit.  Thank you for coming in today  Prediabetes Prediabetes is the condition of having a blood sugar (blood glucose) level that is higher than it should be, but not high enough for you to be diagnosed with type 2 diabetes. Having prediabetes puts you at risk for developing type 2 diabetes (type 2 diabetes mellitus). Prediabetes may be called impaired glucose tolerance or impaired fasting glucose. Prediabetes usually does not cause symptoms. Your health care provider can diagnose this condition with blood tests. You may be tested for prediabetes if you are overweight and if you have at least one other risk factor for prediabetes. What is blood glucose, and how is it measured? Blood glucose refers to the amount of glucose in your bloodstream. Glucose comes from eating foods that contain sugars and starches (carbohydrates), which the body breaks down into glucose. Your blood glucose level may be measured in mg/dL (milligrams per deciliter) or mmol/L (millimoles per liter). Your blood glucose may be checked with one or more of the following blood tests:  A fasting blood glucose (FBG) test. You will not be allowed to eat (you will fast) for 8 hours or longer before a blood sample is taken. ? A normal range for FBG is 70-100 mg/dl (5.6-3.8 mmol/L).  An A1c (hemoglobin A1c) blood test. This test provides information about blood glucose control over the previous 2?60months.  An oral glucose tolerance test (OGTT). This test measures your blood glucose at two times: ? After fasting. This is your baseline level. ? Two hours after you drink a beverage that contains glucose. You may be  diagnosed with prediabetes:  If your FBG is 100?125 mg/dL (7.5-6.4 mmol/L).  If your A1c level is 5.7?6.4%.  If your OGTT result is 140?199 mg/dL (3.3-29 mmol/L). These blood tests may be repeated to confirm your diagnosis. How can this condition affect me? The pancreas produces a hormone (insulin) that helps to move glucose from the bloodstream into cells. When cells in the body do not respond properly to insulin that the body makes (insulin resistance), excess glucose builds up in the blood instead of going into cells. As a result, high blood glucose (hyperglycemia) can develop, which can cause many complications. Hyperglycemia is a symptom of prediabetes. Having high blood glucose for a long time is dangerous. Too much glucose in your blood can damage your nerves and blood vessels. Long-term damage can lead to complications from diabetes, which may include:  Heart disease.  Stroke.  Blindness.  Kidney disease.  Depression.  Poor circulation in the feet and legs, which could lead to surgical removal (amputation) in severe cases. What can increase my risk? Risk factors for prediabetes include:  Having a family member with type 2 diabetes.  Being overweight or obese.  Being older than age 27.  Being of American [place], African-American, Hispanic/Latino, or Asian/Pacific Islander descent.  Having an inactive (sedentary) lifestyle.  Having a history of heart disease.  History of gestational diabetes or polycystic ovary syndrome (PCOS), in women.  Having low levels of good cholesterol (HDL-C) or high levels of blood fats (triglycerides).  Having high blood pressure. What actions can I take to prevent diabetes?  Be physically active. ? Do moderate-intensity physical activity for 30 or more minutes on 5 or more days of the week, or as much as told by your health care provider. This could be brisk walking, biking, or water aerobics. ? Ask your health care provider what  activities are safe for you. A mix of physical activities may be best, such as walking, swimming, cycling, and strength training.  Lose weight as told by your health care provider. ? Losing 5-7% of your body weight can reverse insulin resistance. ? Your health care provider can determine how much weight loss is best for you and can help you lose weight safely.  Follow a healthy meal plan. This includes eating lean proteins, complex carbohydrates, fresh fruits and vegetables, low-fat dairy products, and healthy fats. ? Follow instructions from your health care provider about eating or drinking restrictions. ? Make an appointment to see a diet and nutrition specialist (registered dietitian) to help you create a healthy eating plan that is right for you.  Do not smoke or use any tobacco products, such as cigarettes, chewing tobacco, and e-cigarettes. If you need help quitting, ask your health care provider.  Take over-the-counter and prescription medicines as told by your health care provider. You may be prescribed medicines that help lower the risk of type 2 diabetes.  Keep all follow-up visits as told by your health care provider. This is important. Summary  Prediabetes is the condition of having a blood sugar (blood glucose) level that is higher than it should be, but not high enough for you to be diagnosed with type 2 diabetes.  Having prediabetes puts you at risk for developing type 2 diabetes (type 2 diabetes mellitus).  To help prevent type 2 diabetes, make lifestyle changes such as being physically active and eating a healthy diet. Lose weight as told by your health care provider. This information is not intended to replace advice given to you by your health care provider. Make sure you discuss any questions you have with your health care provider. Document Revised: 05/10/2018 Document Reviewed: 03/09/2015 Elsevier Patient Education  2020 [place].  Prediabetes Eating  Plan Prediabetes is a condition that causes blood sugar (glucose) levels to be higher than normal. This increases the risk for developing diabetes. In order to prevent diabetes from developing, your health care provider may recommend a diet and other lifestyle changes to help you:  Control your blood glucose levels.  Improve your cholesterol levels.  Manage your blood pressure. Your health care provider may recommend working with a diet and nutrition specialist (dietitian) to make a meal plan that is best for you. What are tips for following this plan? Lifestyle  Set weight loss goals with the help of your health care team. It is recommended that most people with prediabetes lose 7% of their current body weight.  Exercise for at least 30 minutes at least 5 days a week.  Attend a support group or seek ongoing support from a mental health counselor.  Take over-the-counter and prescription medicines only as told by your health care provider. Reading food labels  Read food labels to check the amount of fat, salt (sodium), and sugar in prepackaged foods. Avoid foods that have: ? Saturated fats. ? Trans fats. ? Added sugars.  Avoid foods that have more than 300 milligrams (mg) of sodium per serving. Limit your daily sodium intake to less than 2,300 mg each day. Shopping  Avoid buying pre-made and processed foods. Cooking  Cook with  olive oil. Do not use butter, lard, or ghee.  Bake, broil, grill, or boil foods. Avoid frying. Meal planning   Work with your dietitian to develop an eating plan that is right for you. This may include: ? Tracking how many calories you take in. Use a food diary, notebook, or mobile application to track what you eat at each meal. ? Using the glycemic index (GI) to plan your meals. The index tells you how quickly a food will raise your blood glucose. Choose low-GI foods. These foods take a longer time to raise blood glucose.  Consider following a  Mediterranean diet. This diet includes: ? Several servings each day of fresh fruits and vegetables. ? Eating fish at least twice a week. ? Several servings each day of whole grains, beans, nuts, and seeds. ? Using olive oil instead of other fats. ? Moderate alcohol consumption. ? Eating small amounts of red meat and whole-fat dairy.  If you have high blood pressure, you may need to limit your sodium intake or follow a diet such as the DASH eating plan. DASH is an eating plan that aims to lower high blood pressure. What foods are recommended? The items listed below may not be a complete list. Talk with your dietitian about what dietary choices are best for you. Grains Whole grains, such as whole-wheat or whole-grain breads, crackers, cereals, and pasta. Unsweetened oatmeal. Bulgur. Barley. Quinoa. Brown rice. Corn or whole-wheat flour tortillas or taco shells. Vegetables Lettuce. Spinach. Peas. Beets. Cauliflower. Cabbage. Broccoli. Carrots. Tomatoes. Squash. Eggplant. Herbs. Peppers. Onions. Cucumbers. Brussels sprouts. Fruits Berries. Bananas. Apples. Oranges. Grapes. Papaya. Mango. Pomegranate. Kiwi. Grapefruit. Cherries. Meats and other protein foods Seafood. Poultry without skin. Lean cuts of pork and beef. Tofu. Eggs. Nuts. Beans. Dairy Low-fat or fat-free dairy products, such as yogurt, cottage cheese, and cheese. Beverages Water. Tea. Coffee. Sugar-free or diet soda. Seltzer water. Lowfat or no-fat milk. Milk alternatives, such as soy or almond milk. Fats and oils Olive oil. Canola oil. Sunflower oil. Grapeseed oil. Avocado. Walnuts. Sweets and desserts Sugar-free or low-fat pudding. Sugar-free or low-fat ice cream and other frozen treats. Seasoning and other foods Herbs. Sodium-free spices. Mustard. Relish. Low-fat, low-sugar ketchup. Low-fat, low-sugar barbecue sauce. Low-fat or fat-free mayonnaise. What foods are not recommended? The items listed below may not be a complete  list. Talk with your dietitian about what dietary choices are best for you. Grains Refined white flour and flour products, such as bread, pasta, snack foods, and cereals. Vegetables Canned vegetables. Frozen vegetables with butter or cream sauce. Fruits Fruits canned with syrup. Meats and other protein foods Fatty cuts of meat. Poultry with skin. Breaded or fried meat. Processed meats. Dairy Full-fat yogurt, cheese, or milk. Beverages Sweetened drinks, such as sweet iced tea and soda. Fats and oils Butter. Lard. Ghee. Sweets and desserts Baked goods, such as cake, cupcakes, pastries, cookies, and cheesecake. Seasoning and other foods Spice mixes with added salt. Ketchup. Barbecue sauce. Mayonnaise. Summary  To prevent diabetes from developing, you may need to make diet and other lifestyle changes to help control blood sugar, improve cholesterol levels, and manage your blood pressure.  Set weight loss goals with the help of your health care team. It is recommended that most people with prediabetes lose 7 percent of their current body weight.  Consider following a Mediterranean diet that includes plenty of fresh fruits and vegetables, whole grains, beans, nuts, seeds, fish, lean meat, low-fat dairy, and healthy oils. This information is not intended  to replace advice given to you by your health care provider. Make sure you discuss any questions you have with your health care provider. Document Revised: 05/10/2018 Document Reviewed: 03/22/2016 Elsevier Patient Education  [employer].   If you have lab work done today you will be contacted with your lab results within the next 2 weeks.  If you have not heard from [place] then please contact us. The fastest way to get your results is to register for My Chart.   IF you received an x-ray today, you will receive an invoice from [facility]. Please contact [facility] at [phone] with questions or concerns regarding  your invoice.   IF you received labwork today, you will receive an invoice from [facility]. Please contact [facility] at [phone] with questions or concerns regarding your invoice.   Our billing staff will not be able to assist you with questions regarding bills from these companies.  You will be contacted with the lab results as soon as they are available. The fastest way to get your results is to activate your My Chart account. Instructions are located on the last page of this paperwork. If you have not heard from [place] regarding the results in 2 weeks, please contact this office.

## 2019-04-19 LAB — COMPREHENSIVE METABOLIC PANEL
ALT: 16 IU/L (ref 0–32)
AST: 20 IU/L (ref 0–40)
Albumin/Globulin Ratio: 1.7 (ref 1.2–2.2)
Albumin: 4.6 g/dL (ref 3.8–4.8)
Alkaline Phosphatase: 70 IU/L (ref 39–117)
BUN/Creatinine Ratio: 20 (ref 9–23)
BUN: 13 mg/dL (ref 6–24)
Bilirubin Total: 0.4 mg/dL (ref 0.0–1.2)
CO2: 24 mmol/L (ref 20–29)
Calcium: 10 mg/dL (ref 8.7–10.2)
Chloride: 105 mmol/L (ref 96–106)
Creatinine, Ser: 0.66 mg/dL (ref 0.57–1.00)
GFR calc Af Amer: 121 mL/min/{1.73_m2} (ref >59)
GFR calc non Af Amer: 105 mL/min/{1.73_m2} (ref >59)
Globulin, Total: 2.7 g/dL (ref 1.5–4.5)
Glucose: 88 mg/dL (ref 65–99)
Potassium: 4.4 mmol/L (ref 3.5–5.2)
Sodium: 140 mmol/L (ref 134–144)
Total Protein: 7.3 g/dL (ref 6.0–8.5)

## 2019-04-19 LAB — LIPID PANEL
Chol/HDL Ratio: 3.6 ratio (ref 0.0–4.4)
Cholesterol, Total: 188 mg/dL (ref 100–199)
HDL: 52 mg/dL (ref >39)
LDL Chol Calc (NIH): 124 mg/dL — ABNORMAL HIGH (ref 0–99)
Triglycerides: 65 mg/dL (ref 0–149)
VLDL Cholesterol Cal: 12 mg/dL (ref 5–40)

## 2019-05-09 ENCOUNTER — Ambulatory Visit: Payer: BLUE CROSS/BLUE SHIELD

## 2019-05-10 ENCOUNTER — Ambulatory Visit: Payer: BLUE CROSS/BLUE SHIELD | Attending: Internal Medicine

## 2019-05-10 DIAGNOSIS — Z23 Encounter for immunization: Secondary | ICD-10-CM

## 2019-05-10 NOTE — Progress Notes (Signed)
   Covid-19 Vaccination Clinic  Name:  Micaila Ziemba    MRN: [id number] DOB: April 10, 1970  05/10/2019  Ms. Reniyah was observed post Covid-19 immunization for 15 minutes without incident. She was provided with Vaccine Information Sheet and instruction to access the V-Safe system.   Ms. Tzipporah was instructed to call 911 with any severe reactions post vaccine: Marland Kitchen Difficulty breathing  . Swelling of face and throat  . A fast heartbeat  . A bad rash all over body  . Dizziness and weakness   Immunizations Administered    Name Date Dose VIS Date Route   Pfizer COVID-19 Vaccine 05/10/2019  3:47 PM 0.3 mL 01/10/2019 Intramuscular   Manufacturer: [employer], [employer]   Lot: [id number]   NDC: [address]-1

## 2019-05-14 ENCOUNTER — Ambulatory Visit: Payer: BLUE CROSS/BLUE SHIELD | Admitting: Family Medicine

## 2019-05-14 ENCOUNTER — Other Ambulatory Visit: Payer: Self-pay

## 2019-05-16 ENCOUNTER — Ambulatory Visit: Payer: BLUE CROSS/BLUE SHIELD | Admitting: Family Medicine

## 2019-06-03 ENCOUNTER — Ambulatory Visit: Payer: BLUE CROSS/BLUE SHIELD | Attending: Internal Medicine

## 2019-06-03 DIAGNOSIS — Z23 Encounter for immunization: Secondary | ICD-10-CM

## 2019-06-03 NOTE — Progress Notes (Signed)
   Covid-19 Vaccination Clinic  Name:  [name]    MRN: [id number] DOB: August 26, 1970  06/03/2019  Ms. [name] was observed post Covid-19 immunization for 15 minutes without incident. She was provided with Vaccine Information Sheet and instruction to access the V-Safe system.   Ms. [name] was instructed to call 911 with any severe reactions post vaccine: Marland Kitchen Difficulty breathing  . Swelling of face and throat  . A fast heartbeat  . A bad rash all over body  . Dizziness and weakness   Immunizations Administered    Name Date Dose VIS Date Route   Pfizer COVID-19 Vaccine 06/03/2019 10:04 AM 0.3 mL 03/26/2018 Intramuscular   Manufacturer: [employer], [employer]   Lot: [id number]   NDC: [address]-5

## 2019-07-07 ENCOUNTER — Ambulatory Visit: Payer: BLUE CROSS/BLUE SHIELD | Admitting: Family Medicine

## 2019-07-07 ENCOUNTER — Encounter: Payer: Self-pay | Admitting: Family Medicine

## 2019-07-07 ENCOUNTER — Other Ambulatory Visit: Payer: Self-pay

## 2019-07-07 ENCOUNTER — Ambulatory Visit (INDEPENDENT_AMBULATORY_CARE_PROVIDER_SITE_OTHER): Payer: BLUE CROSS/BLUE SHIELD

## 2019-07-07 VITALS — BP 121/82 | HR 82 | Temp 98.6°F | Ht 66.0 in | Wt 225.0 lb

## 2019-07-07 DIAGNOSIS — R1084 Generalized abdominal pain: Secondary | ICD-10-CM

## 2019-07-07 DIAGNOSIS — Z87898 Personal history of other specified conditions: Secondary | ICD-10-CM | POA: Diagnosis not present

## 2019-07-07 LAB — POCT URINALYSIS DIP (MANUAL ENTRY)
Bilirubin, UA: NEGATIVE
Blood, UA: NEGATIVE
Glucose, UA: NEGATIVE mg/dL
Ketones, POC UA: NEGATIVE mg/dL
Leukocytes, UA: NEGATIVE
Nitrite, UA: NEGATIVE
Protein Ur, POC: NEGATIVE mg/dL
Spec Grav, UA: >=1.03 — AB (ref 1.010–1.025)
Urobilinogen, UA: 0.2 E.U./dL
pH, UA: 5 (ref 5.0–8.0)

## 2019-07-07 LAB — POCT CBC
Granulocyte percent: 56.9 %G (ref 37–80)
HCT, POC: 44.2 % — AB (ref 29–41)
Hemoglobin: 13.8 g/dL (ref 11–14.6)
Lymph, poc: 1.8 (ref 0.6–3.4)
MCH, POC: 25 pg — AB (ref 27–31.2)
MCHC: 31.2 g/dL — AB (ref 31.8–35.4)
MCV: 80.1 fL (ref 76–111)
MID (cbc): 1.2 — AB (ref 0–0.9)
MPV: 8.2 fL (ref 0–99.8)
POC Granulocyte: 4 (ref 2–6.9)
POC LYMPH PERCENT: 25.7 %L (ref 10–50)
POC MID %: 17.4 %M — AB (ref 0–12)
Platelet Count, POC: 245 10*3/uL (ref 142–424)
RBC: 5.51 M/uL — AB (ref 4.04–5.48)
RDW, POC: 13.7 %
WBC: 7 10*3/uL (ref 4.6–10.2)

## 2019-07-07 LAB — POCT URINE PREGNANCY: Preg Test, Ur: NEGATIVE

## 2019-07-07 LAB — GLUCOSE, POCT (MANUAL RESULT ENTRY): POC Glucose: 84 mg/dl (ref 70–99)

## 2019-07-07 MED ORDER — OMEPRAZOLE 20 MG PO CPDR: 20.0000 mg | DELAYED_RELEASE_CAPSULE | Freq: Every day | ORAL | 0 refills | Status: AC

## 2019-07-07 NOTE — Progress Notes (Signed)
Subjective:  Patient ID: [name], female    DOB: [birth year]  Age: 49 y.o. MRN: [id number]  CC:  Chief Complaint  Patient presents with  . Abdominal Pain    pt reports her stomach feels hot and it is painfull like her stomach is bitting her. pain is loacted through out the whole stomach area. this stated early this past week. pt has tried some kind of anit achid to calm the stomach but it didn't help. pt reports that her stomach feels hard. pt states no mater if she eat or not stomach is hard and is painful.   Speaks Somali, dtr translating. [employer] video interpreter # [phone],   HPI Gizzelle Lacomb presents for   Abdominal pain: Past 1 week.  No fever, no nausea/vomiting.  No diarrhea. No blood in stool.  Sore all over. Tight feeling. ? Gas feeling. Last BM today - normal BM's. Unable to describe pain quality. Sore in back that moves to front, both sides, past week.  No dysuria, some nocturia - 4-5 times past week, but drinking lots of water.  Diarrhea after eating eggs at times, but not this week.  Able to eat ok - no change with food.  No belching, burping, or heartburn.  No vaginal bleeding.  Treated for H. pylori in July 2019 with Biaxin, amoxicillin, metronidazole.  Tx: otc heartburn med twice - no change.   LMP - 2011. Menopause?  History Patient Active Problem List   Diagnosis Date Noted  . Hypothyroidism 05/23/2016  . Esophageal reflux 08/05/2015  . 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 Goiter 08/05/2015  . Hyperthyroidism 08/05/2015   Past Medical History:  Diagnosis Date  . Allergy   . Thyroid disease    Past Surgical History:  Procedure Laterality Date  . DILATION AND CURETTAGE OF UTERUS     No Known Allergies Prior to Admission medications   Medication Sig Start Date End Date Taking? Authorizing Provider  acetaminophen (TYLENOL) 325 MG tablet Take 650 mg by mouth every 6 (six) hours as needed.   Yes [provider]  Cholecalciferol (VITAMIN D PO) Take 1  capsule by mouth daily.   Yes [provider]  diclofenac Sodium (VOLTAREN) 1 % GEL Apply 2 g topically 4 (four) times daily. 03/14/19  Yes Maximiano Coss, NP  levothyroxine (SYNTHROID, LEVOTHROID) 75 MCG tablet Take 1 tablet (75 mcg total) by mouth daily before breakfast. 10/23/16  Yes Timmothy Euler, [place] D, PA-C  meloxicam (MOBIC) 15 MG tablet Take 1 tablet (15 mg total) by mouth daily. 03/14/19  Yes Maximiano Coss, NP  metroNIDAZOLE (FLAGYL) 500 MG tablet 1 pill by mouth twice per day.  Avoid any alcohol while taking this medicine. 10/25/18  Yes Wendie Agreste, MD   Social History   Socioeconomic History  . Marital status: Married    Spouse name: Ellon Marasco  . Number of children: 2  . Years of education: 9th grade  . Highest education level: Not on file  Occupational History  . Occupation: Housekeeping  Tobacco Use  . Smoking status: Former Smoker    Quit date: 05/30/2013    Years since quitting: 6.1  . Smokeless tobacco: Never Used  . Tobacco comment: smoked 2-3 cigarettes a day for 5 years  Substance and Sexual Activity  . Alcohol use: No    Alcohol/week: 0.0 standard drinks  . Drug use: No  . Sexual activity: Yes    Partners: Male  Other Topics Concern  . Not on file  Social  History Narrative   From [place]. Came to the [place] 2000. Lives with her husband and their two daughters.   Social Determinants of Health   Financial Resource Strain:   . Difficulty of Paying Living Expenses:   Food Insecurity:   . Worried About Programme researcher, broadcasting/film/video in the Last Year:   . Barista in the Last Year:   Transportation Needs:   . Freight forwarder (Medical):   Marland Kitchen Lack of Transportation (Non-Medical):   Physical Activity:   . Days of Exercise per Week:   . Minutes of Exercise per Session:   Stress:   . Feeling of Stress :   Social Connections:   . Frequency of Communication with Friends and Family:   . Frequency of Social Gatherings with Friends and Family:   .  Attends Religious Services:   . Active Member of Clubs or Organizations:   . Attends Banker Meetings:   Marland Kitchen Marital Status:   Intimate Partner Violence:   . Fear of Current or Ex-Partner:   . Emotionally Abused:   Marland Kitchen Physically Abused:   . Sexually Abused:     Review of Systems  Per HPI.  Objective:   Vitals:   07/07/19 1526  BP: 121/82  Pulse: 82  Temp: 98.6 F (37 C)  TempSrc: Temporal  SpO2: 98%  Weight: 225 lb (102.1 kg)  Height: 5\' 6"  (1.676 m)     Physical Exam Constitutional:      General: She is not in acute distress.    Appearance: She is well-developed.  HENT:     Head: Normocephalic and atraumatic.  Cardiovascular:     Rate and Rhythm: Normal rate.  Pulmonary:     Effort: Pulmonary effort is normal.  Abdominal:     Tenderness: There is abdominal tenderness in the right lower quadrant, suprapubic area and left lower quadrant. There is no right CVA tenderness, left CVA tenderness, guarding or rebound. Negative signs include Murphy's sign and McBurney's sign.  Neurological:     Mental Status: She is alert and oriented to person, place, and time.    Results for orders placed or performed in visit on 07/07/19  Comprehensive metabolic panel  Result Value Ref Range   Glucose 84 65 - 99 mg/dL   BUN 16 6 - 24 mg/dL   Creatinine, Ser 09/06/19 0.57 - 1.00 mg/dL   GFR calc non Af Amer 103 >59 mL/min/1.73   GFR calc Af Amer 118 >59 mL/min/1.73   BUN/Creatinine Ratio 23 9 - 23   Sodium 142 134 - 144 mmol/L   Potassium 4.4 3.5 - 5.2 mmol/L   Chloride 104 96 - 106 mmol/L   CO2 24 20 - 29 mmol/L   Calcium 9.5 8.7 - 10.2 mg/dL   Total Protein 7.4 6.0 - 8.5 g/dL   Albumin 4.6 3.8 - 4.8 g/dL   Globulin, Total 2.8 1.5 - 4.5 g/dL   Albumin/Globulin Ratio 1.6 1.2 - 2.2   Bilirubin Total <0.2 0.0 - 1.2 mg/dL   Alkaline Phosphatase 80 48 - 121 IU/L   AST 22 0 - 40 IU/L   ALT 15 0 - 32 IU/L  Lipase  Result Value Ref Range   Lipase 27 14 - 72 U/L  POCT  urinalysis dipstick  Result Value Ref Range   Color, UA yellow yellow   Clarity, UA clear clear   Glucose, UA negative negative mg/dL   Bilirubin, UA negative negative   Ketones, POC UA  negative negative mg/dL   Spec Grav, UA >=9.735 (A) 1.010 - 1.025   Blood, UA negative negative   pH, UA 5.0 5.0 - 8.0   Protein Ur, POC negative negative mg/dL   Urobilinogen, UA 0.2 0.2 or 1.0 E.U./dL   Nitrite, UA Negative Negative   Leukocytes, UA Negative Negative  POCT glucose (manual entry)  Result Value Ref Range   POC Glucose 84 70 - 99 mg/dl  POCT CBC  Result Value Ref Range   WBC 7.0 4.6 - 10.2 K/uL   Lymph, poc 1.8 0.6 - 3.4   POC LYMPH PERCENT 25.7 10 - 50 %L   MID (cbc) 1.2 (A) 0 - 0.9   POC MID % 17.4 (A) 0 - 12 %M   POC Granulocyte 4.0 2 - 6.9   Granulocyte percent 56.9 37 - 80 %G   RBC 5.51 (A) 4.04 - 5.48 M/uL   Hemoglobin 13.8 11 - 14.6 g/dL   HCT, POC 32.9 (A) 29 - 41 %   MCV 80.1 76 - 111 fL   MCH, POC 25.0 (A) 27 - 31.2 pg   MCHC 31.2 (A) 31.8 - 35.4 g/dL   RDW, POC 92.4 %   Platelet Count, POC 245 142 - 424 K/uL   MPV 8.2 0 - 99.8 fL  POCT urine pregnancy  Result Value Ref Range   Preg Test, Ur Negative Negative   DG Abd 1 View  Result Date: 07/07/2019 CLINICAL DATA:  49 year old female with lower abdominal pain for 1 week. EXAM: ABDOMEN - 1 VIEW COMPARISON:  Abdominal radiographs 07/27/2017 and earlier. FINDINGS: AP views of the abdomen and pelvis. Negative lung bases. Non obstructed bowel gas pattern. Negative abdominal and pelvic visceral contours. Mild retained stool in the colon similar to the 2019 exam. No acute osseous abnormality identified. IMPRESSION: Stable, negative. Electronically Signed   By: Odessa Fleming M.D.   On: 07/07/2019 16:50    Assessment & Plan:  Darrah Dredge is a 49 y.o. female . Generalized abdominal pain - Plan: POCT urinalysis dipstick, Comprehensive metabolic panel, POCT glucose (manual entry), POCT CBC, POCT urine pregnancy, Lipase, DG Abd 1  View, CANCELED: POCT Microscopic Urinalysis ([facility])  History of nocturia - Plan: POCT glucose (manual entry)  Reassuring glucose, CBC, CMP, lipase, imaging.  Potential slight increase stool, denies true constipation, but could try stool softener versus MiraLAX.  She has other over-the-counter treatment she has used previously.  Symptoms more lower than upper, less likely peptic ulcer/gastritis/heartburn, but can try omeprazole temporarily.  Recheck 3 days, and if persistent or worsening pain, consider CT imaging.  ER precautions given  Meds ordered this encounter  Medications  . omeprazole (PRILOSEC) 20 MG capsule    Sig: Take 1 capsule (20 mg total) by mouth daily.    Dispense:  30 capsule    Refill:  0   Patient Instructions   Omeprazole once per day.  Bland diet for next few days - no spicy food, fried food.  Recheck in 3 days.  Return to the clinic or go to the nearest emergency room if any of your symptoms worsen or new symptoms occur.  Abdominal Pain, Adult Pain in the abdomen (abdominal pain) can be caused by many things. Often, abdominal pain is not serious and it gets better with no treatment or by being treated at home. However, sometimes abdominal pain is serious. Your health care provider will ask questions about your medical history and do a physical exam to try to determine  the cause of your abdominal pain. Follow these instructions at home:  Medicines  Take over-the-counter and prescription medicines only as told by your health care provider.  Do not take a laxative unless told by your health care provider. General instructions  Watch your condition for any changes.  Drink enough fluid to keep your urine pale yellow.  Keep all follow-up visits as told by your health care provider. This is important. Contact a health care provider if:  Your abdominal pain changes or gets worse.  You are not hungry or you lose weight without trying.  You are constipated or have  diarrhea for more than 2-3 days.  You have pain when you urinate or have a bowel movement.  Your abdominal pain wakes you up at night.  Your pain gets worse with meals, after eating, or with certain foods.  You are vomiting and cannot keep anything down.  You have a fever.  You have blood in your urine. Get help right away if:  Your pain does not go away as soon as your health care provider told you to expect.  You cannot stop vomiting.  Your pain is only in areas of the abdomen, such as the right side or the left lower portion of the abdomen. Pain on the right side could be caused by appendicitis.  You have bloody or black stools, or stools that look like tar.  You have severe pain, cramping, or bloating in your abdomen.  You have signs of dehydration, such as: ? Dark urine, very little urine, or no urine. ? Cracked lips. ? Dry mouth. ? Sunken eyes. ? Sleepiness. ? Weakness.  You have trouble breathing or chest pain. Summary  Often, abdominal pain is not serious and it gets better with no treatment or by being treated at home. However, sometimes abdominal pain is serious.  Watch your condition for any changes.  Take over-the-counter and prescription medicines only as told by your health care provider.  Contact a health care provider if your abdominal pain changes or gets worse.  Get help right away if you have severe pain, cramping, or bloating in your abdomen. This information is not intended to replace advice given to you by your health care provider. Make sure you discuss any questions you have with your health care provider. Document Revised: 05/27/2018 Document Reviewed: 05/27/2018 Elsevier Patient Education  [employer].   If you have lab work done today you will be contacted with your lab results within the next 2 weeks.  If you have not heard from [place] then please contact us. The fastest way to get your results is to register for My Chart.   IF you  received an x-ray today, you will receive an invoice from [facility]. Please contact [facility] at [phone] with questions or concerns regarding your invoice.   IF you received labwork today, you will receive an invoice from [facility]. Please contact [facility] at [phone] with questions or concerns regarding your invoice.   Our billing staff will not be able to assist you with questions regarding bills from these companies.  You will be contacted with the lab results as soon as they are available. The fastest way to get your results is to activate your My Chart account. Instructions are located on the last page of this paperwork. If you have not heard from [place] regarding the results in 2 weeks, please contact this office.         Signed, Meredith Staggers, MD  [facility]

## 2019-07-07 NOTE — Patient Instructions (Addendum)
Omeprazole once per day.  Bland diet for next few days - no spicy food, fried food.  Recheck in 3 days.  Return to the clinic or go to the nearest emergency room if any of your symptoms worsen or new symptoms occur.  Abdominal Pain, Adult Pain in the abdomen (abdominal pain) can be caused by many things. Often, abdominal pain is not serious and it gets better with no treatment or by being treated at home. However, sometimes abdominal pain is serious. Your health care provider will ask questions about your medical history and do a physical exam to try to determine the cause of your abdominal pain. Follow these instructions at home:  Medicines  Take over-the-counter and prescription medicines only as told by your health care provider.  Do not take a laxative unless told by your health care provider. General instructions  Watch your condition for any changes.  Drink enough fluid to keep your urine pale yellow.  Keep all follow-up visits as told by your health care provider. This is important. Contact a health care provider if:  Your abdominal pain changes or gets worse.  You are not hungry or you lose weight without trying.  You are constipated or have diarrhea for more than 2-3 days.  You have pain when you urinate or have a bowel movement.  Your abdominal pain wakes you up at night.  Your pain gets worse with meals, after eating, or with certain foods.  You are vomiting and cannot keep anything down.  You have a fever.  You have blood in your urine. Get help right away if:  Your pain does not go away as soon as your health care provider told you to expect.  You cannot stop vomiting.  Your pain is only in areas of the abdomen, such as the right side or the left lower portion of the abdomen. Pain on the right side could be caused by appendicitis.  You have bloody or black stools, or stools that look like tar.  You have severe pain, cramping, or bloating in your  abdomen.  You have signs of dehydration, such as: ? Dark urine, very little urine, or no urine. ? Cracked lips. ? Dry mouth. ? Sunken eyes. ? Sleepiness. ? Weakness.  You have trouble breathing or chest pain. Summary  Often, abdominal pain is not serious and it gets better with no treatment or by being treated at home. However, sometimes abdominal pain is serious.  Watch your condition for any changes.  Take over-the-counter and prescription medicines only as told by your health care provider.  Contact a health care provider if your abdominal pain changes or gets worse.  Get help right away if you have severe pain, cramping, or bloating in your abdomen. This information is not intended to replace advice given to you by your health care provider. Make sure you discuss any questions you have with your health care provider. Document Revised: 05/27/2018 Document Reviewed: 05/27/2018 Elsevier Patient Education  [employer].   If you have lab work done today you will be contacted with your lab results within the next 2 weeks.  If you have not heard from [place] then please contact us. The fastest way to get your results is to register for My Chart.   IF you received an x-ray today, you will receive an invoice from [facility]. Please contact [facility] at [phone] with questions or concerns regarding your invoice.   IF you received labwork today, you will  receive an invoice from [employer]. Please contact [facility] at [phone] with questions or concerns regarding your invoice.   Our billing staff will not be able to assist you with questions regarding bills from these companies.  You will be contacted with the lab results as soon as they are available. The fastest way to get your results is to activate your My Chart account. Instructions are located on the last page of this paperwork. If you have not heard from [place] regarding the results in 2 weeks, please  contact this office.

## 2019-07-08 ENCOUNTER — Encounter: Payer: Self-pay | Admitting: Family Medicine

## 2019-07-08 LAB — COMPREHENSIVE METABOLIC PANEL
ALT: 15 IU/L (ref 0–32)
AST: 22 IU/L (ref 0–40)
Albumin/Globulin Ratio: 1.6 (ref 1.2–2.2)
Albumin: 4.6 g/dL (ref 3.8–4.8)
Alkaline Phosphatase: 80 IU/L (ref 48–121)
BUN/Creatinine Ratio: 23 (ref 9–23)
BUN: 16 mg/dL (ref 6–24)
Bilirubin Total: <0.2 mg/dL (ref 0.0–1.2)
CO2: 24 mmol/L (ref 20–29)
Calcium: 9.5 mg/dL (ref 8.7–10.2)
Chloride: 104 mmol/L (ref 96–106)
Creatinine, Ser: 0.7 mg/dL (ref 0.57–1.00)
GFR calc Af Amer: 118 mL/min/{1.73_m2} (ref >59)
GFR calc non Af Amer: 103 mL/min/{1.73_m2} (ref >59)
Globulin, Total: 2.8 g/dL (ref 1.5–4.5)
Glucose: 84 mg/dL (ref 65–99)
Potassium: 4.4 mmol/L (ref 3.5–5.2)
Sodium: 142 mmol/L (ref 134–144)
Total Protein: 7.4 g/dL (ref 6.0–8.5)

## 2019-07-08 LAB — LIPASE: Lipase: 27 U/L (ref 14–72)

## 2022-01-12 IMAGING — DX DG KNEE COMPLETE 4+V*L*
4 series · 4 of 4 positions shown · non-contrast
Comparison: None.

CLINICAL DATA: Acute left knee pain.

EXAM:
LEFT KNEE - COMPLETE 4+ VIEW

[knee ap]
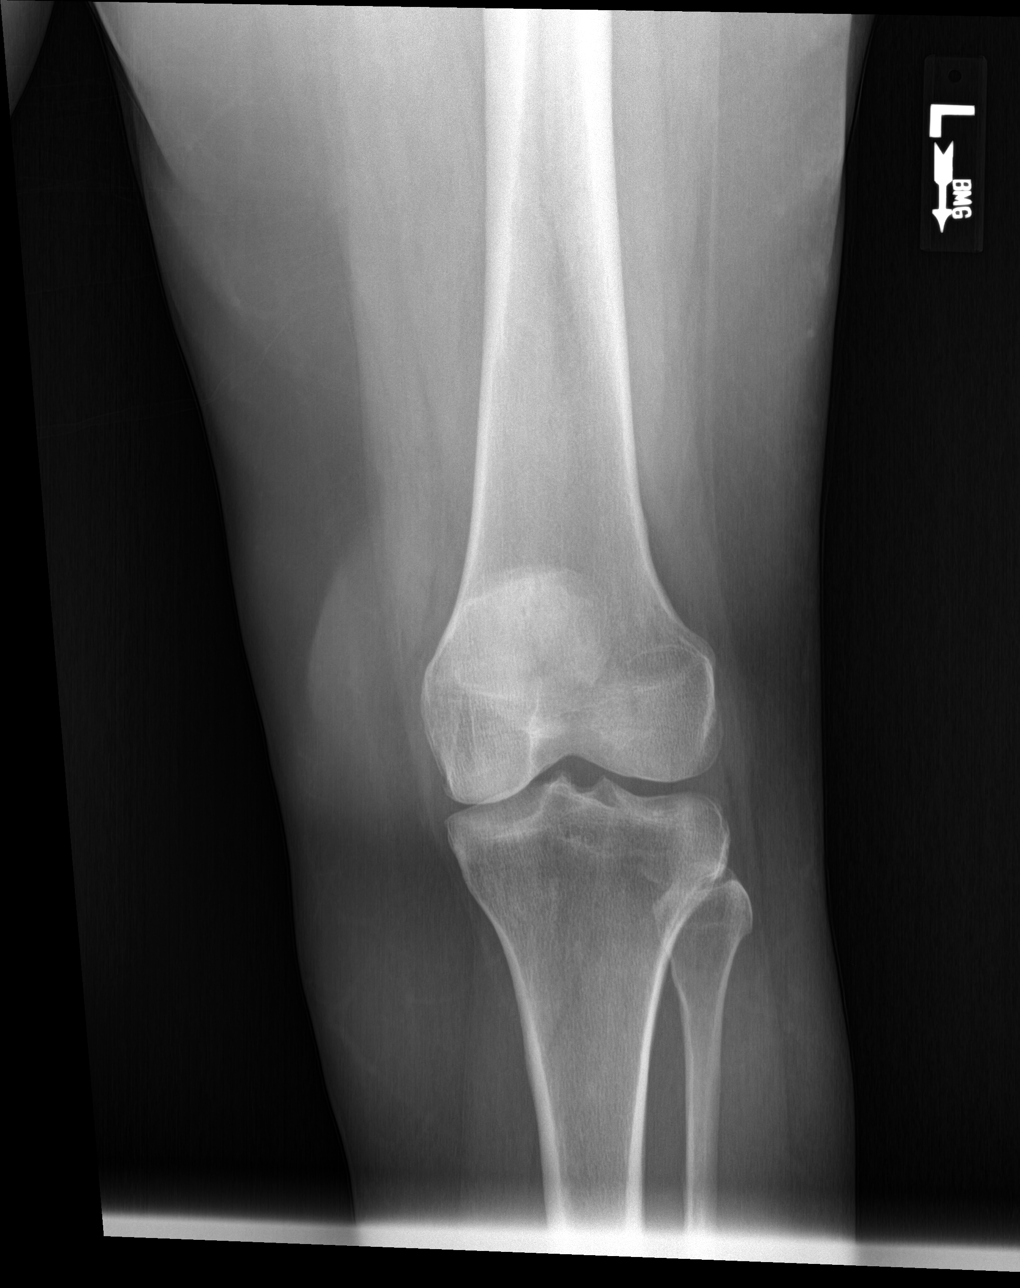

[knee lat]
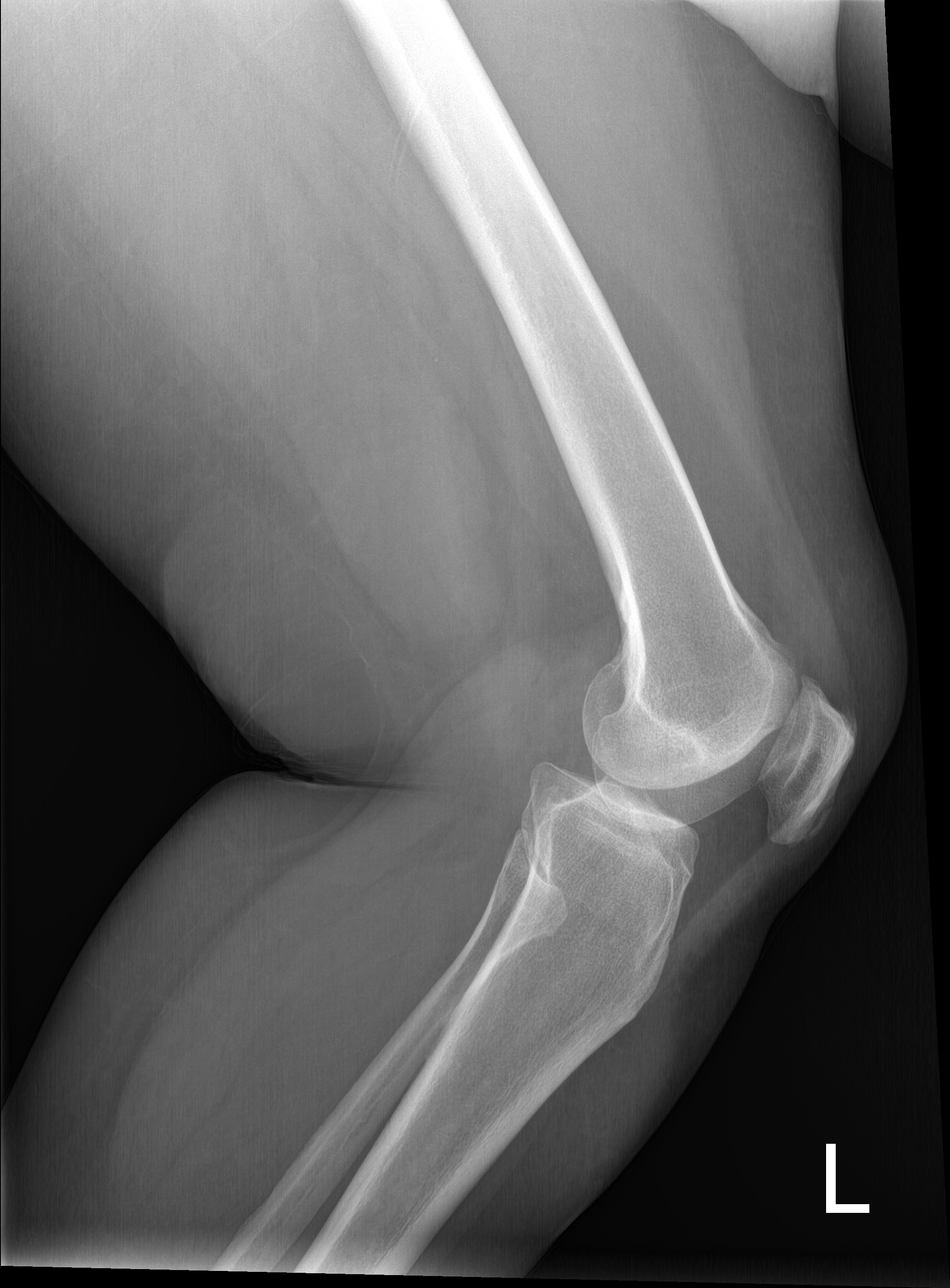

[sunrise]
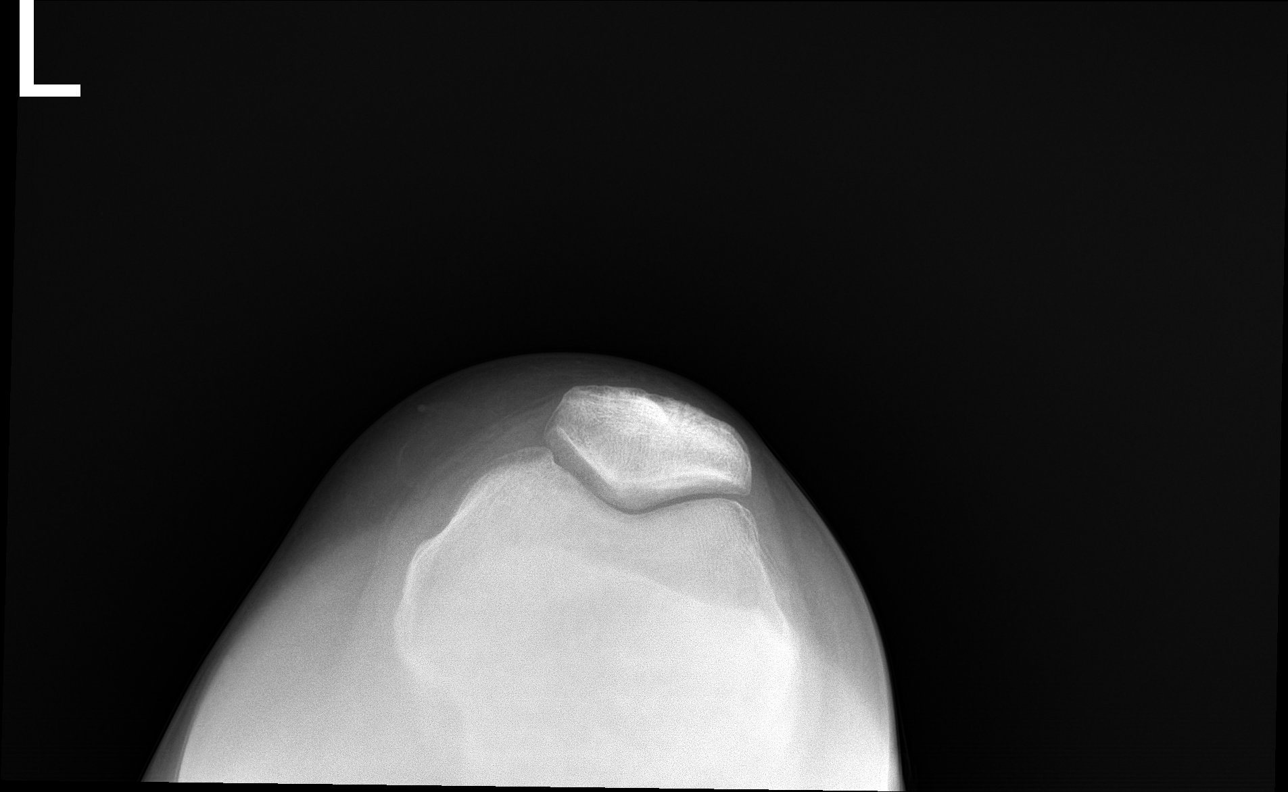

[knee [person_name]]
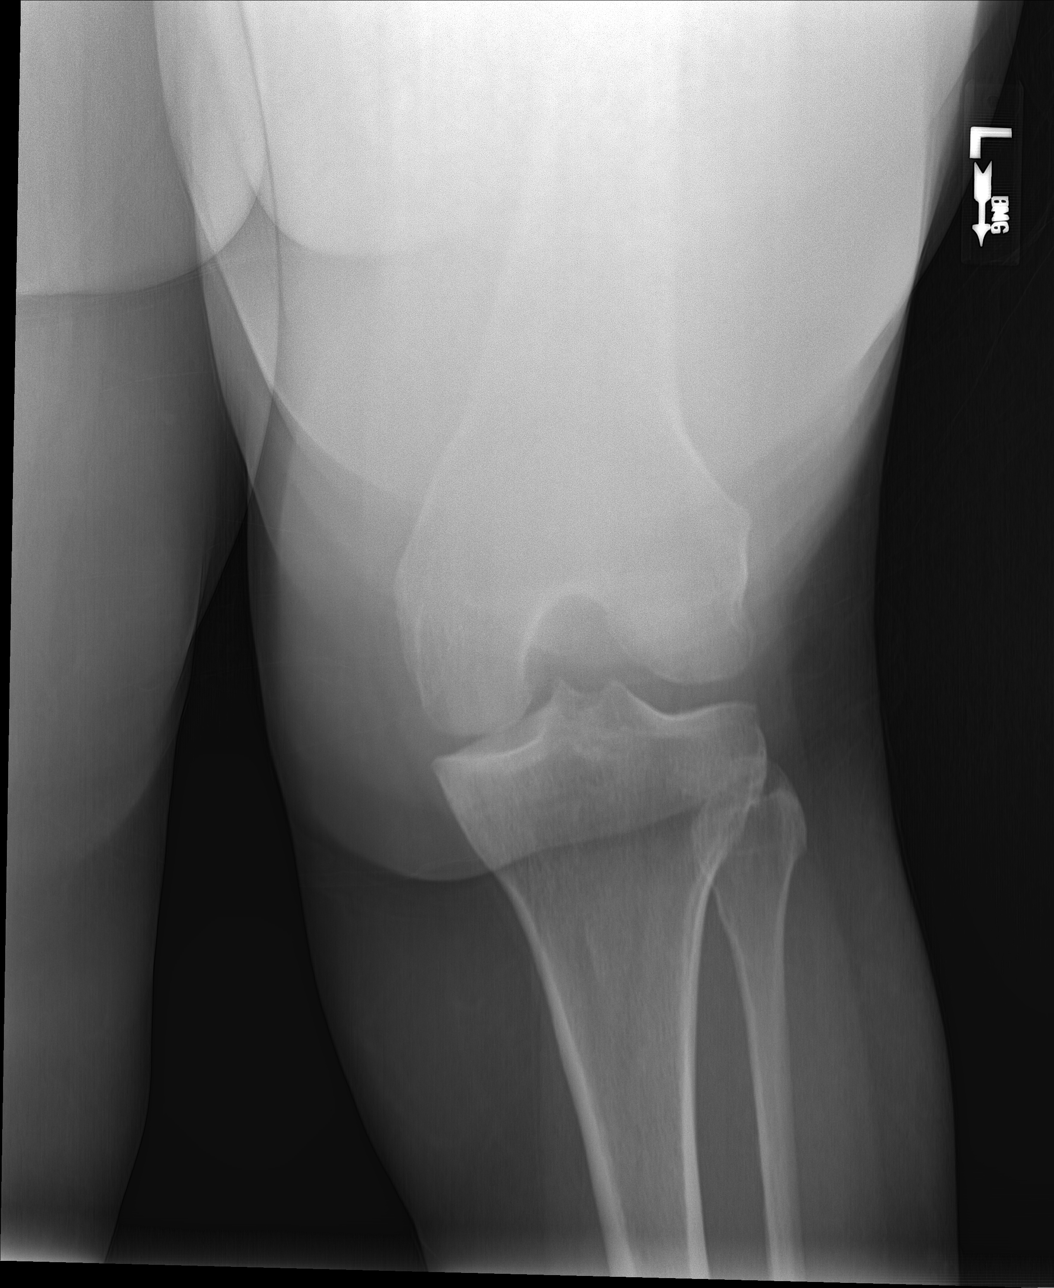

[4 of 4 positions shown; findings below may reference images not displayed]

FINDINGS: Mild degenerative changes in the medial patellofemoral compartments.
No joint effusion. No fractures. No other acute abnormalities.
IMPRESSION: Mild degenerative changes as above.

## 2022-05-07 IMAGING — DX DG ABDOMEN 1V
2 series · 2 of 2 positions shown · non-contrast
Comparison: Abdominal radiographs 07/27/2017 and earlier.

CLINICAL DATA: 49-year-old female with lower abdominal pain for 1
week.

EXAM:
ABDOMEN - 1 VIEW

[abdomen kub (1 of 2)]
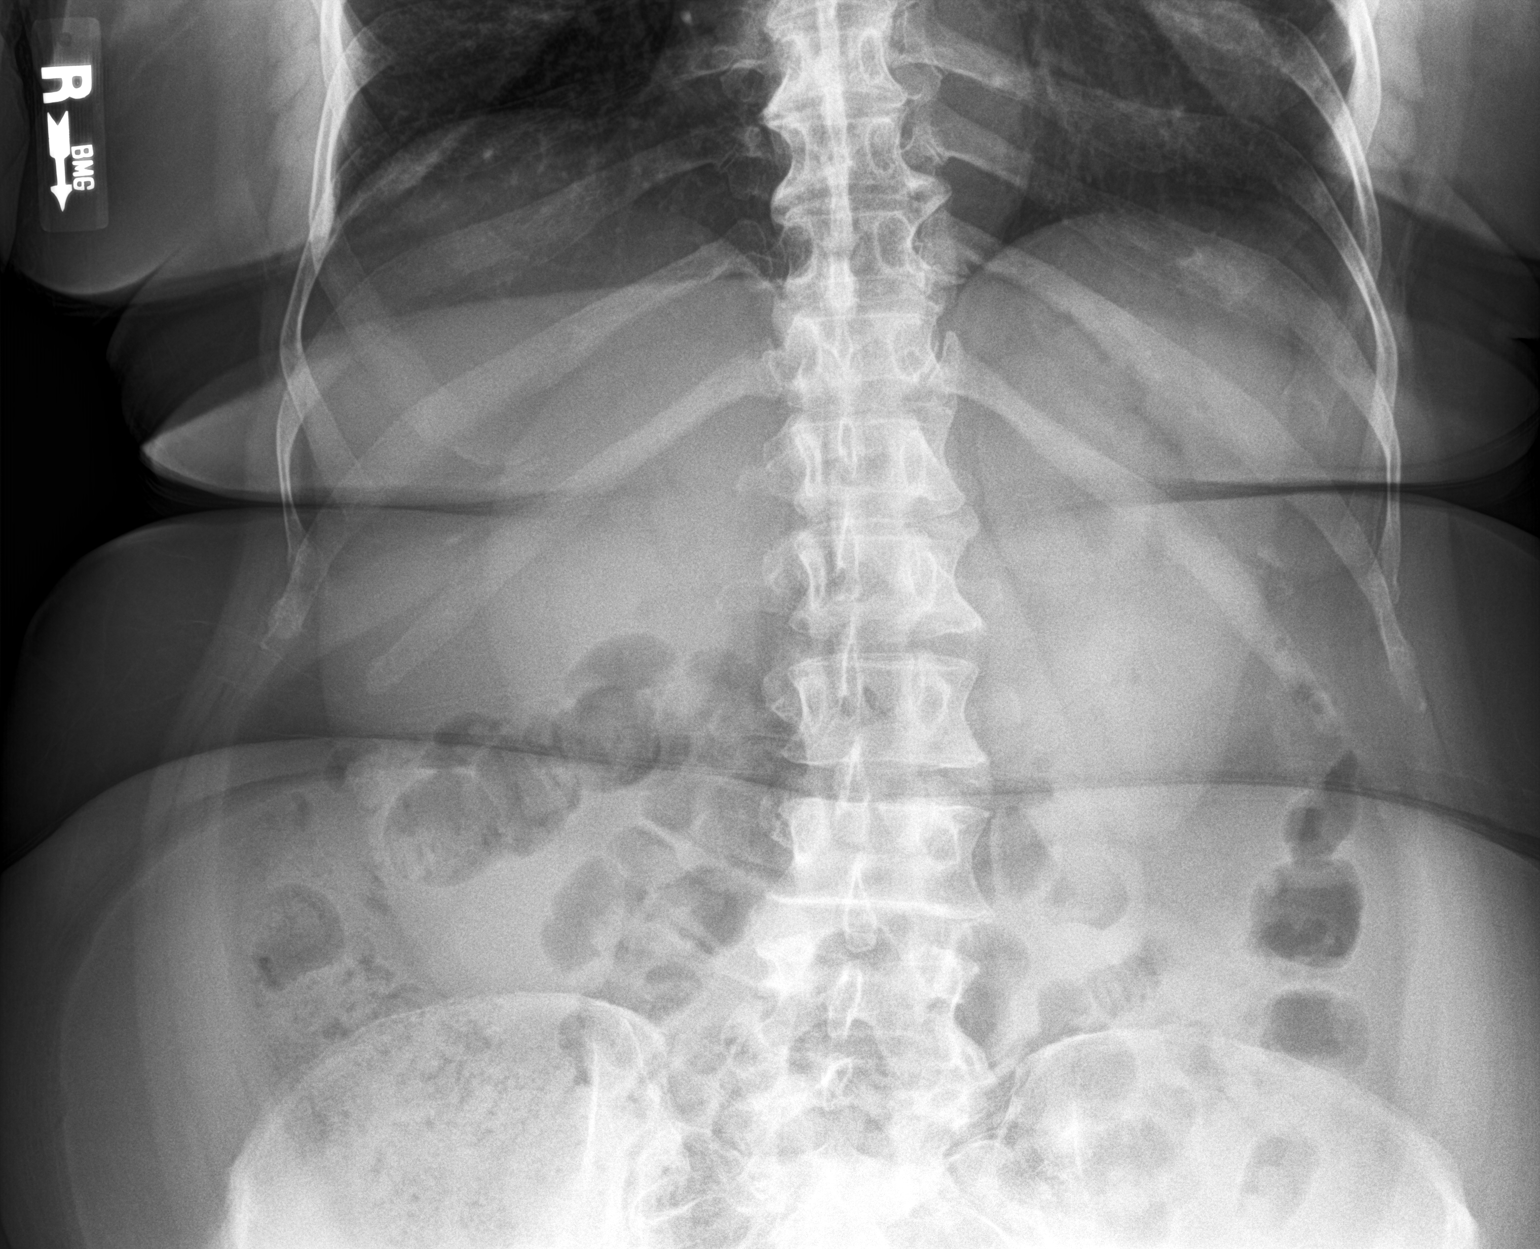

[abdomen kub (2 of 2)]
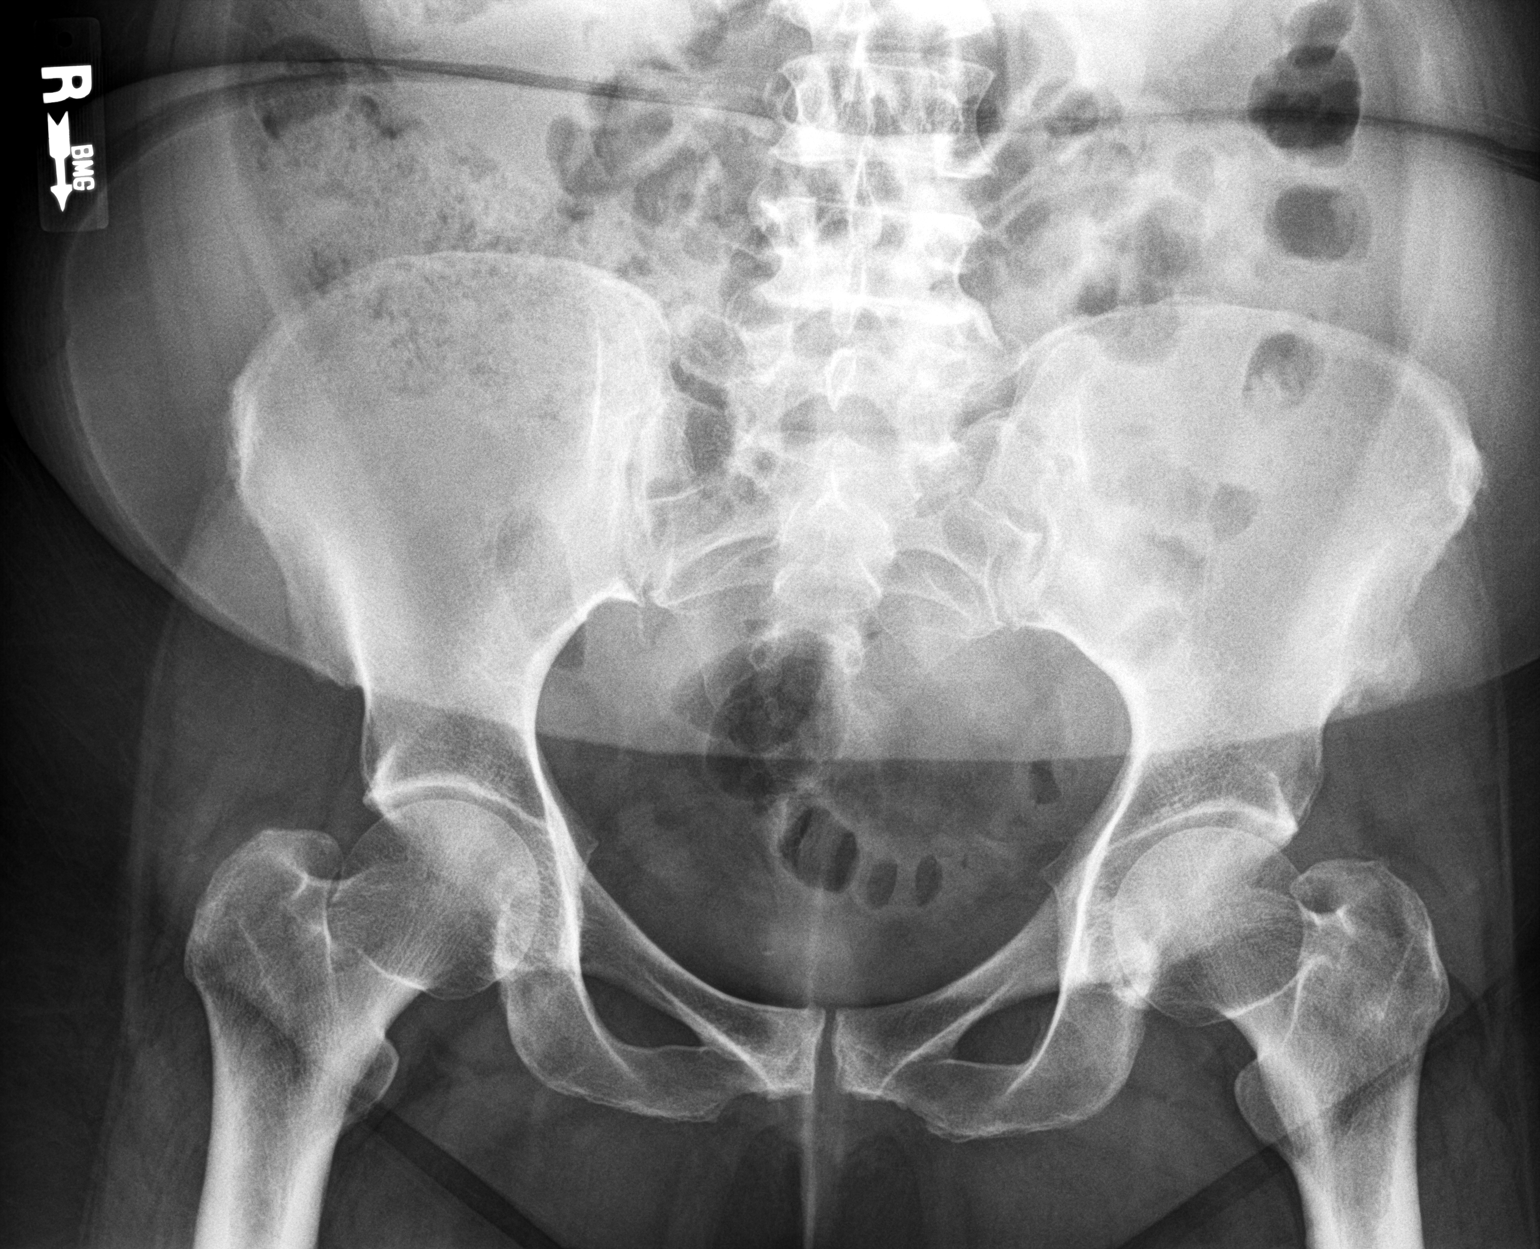

[2 of 2 positions shown; findings below may reference images not displayed]

FINDINGS: AP views of the abdomen and pelvis. Negative lung bases. Non
obstructed bowel gas pattern. Negative abdominal and pelvic visceral
contours. Mild retained stool in the colon similar to the 7961 exam.
No acute osseous abnormality identified.
IMPRESSION: Stable, negative.

## 2023-12-10 ENCOUNTER — Other Ambulatory Visit: Payer: Self-pay

## 2023-12-10 DIAGNOSIS — Z1231 Encounter for screening mammogram for malignant neoplasm of breast: Secondary | ICD-10-CM

## 2024-02-22 ENCOUNTER — Ambulatory Visit: Admission: RE | Admit: 2024-02-22 | Source: Ambulatory Visit

## 2024-02-22 DIAGNOSIS — Z1231 Encounter for screening mammogram for malignant neoplasm of breast: Secondary | ICD-10-CM
